# Patient Record
Sex: Female | Born: 1970 | Race: Black or African American | Hispanic: No | Marital: Single | Smoking: Never smoker
Health system: Southern US, Community
[De-identification: ages and names within clinical notes are randomized; demographics above are authoritative.]

## PROBLEM LIST (undated history)

## (undated) DIAGNOSIS — E119 Type 2 diabetes mellitus without complications: Secondary | ICD-10-CM

## (undated) DIAGNOSIS — K5900 Constipation, unspecified: Secondary | ICD-10-CM

## (undated) DIAGNOSIS — K3184 Gastroparesis: Secondary | ICD-10-CM

## (undated) DIAGNOSIS — Z992 Dependence on renal dialysis: Secondary | ICD-10-CM

## (undated) DIAGNOSIS — F32A Depression, unspecified: Secondary | ICD-10-CM

## (undated) DIAGNOSIS — E785 Hyperlipidemia, unspecified: Secondary | ICD-10-CM

## (undated) DIAGNOSIS — N186 End stage renal disease: Secondary | ICD-10-CM

## (undated) DIAGNOSIS — I10 Essential (primary) hypertension: Secondary | ICD-10-CM

## (undated) DIAGNOSIS — Z8639 Personal history of other endocrine, nutritional and metabolic disease: Secondary | ICD-10-CM

## (undated) DIAGNOSIS — R519 Headache, unspecified: Secondary | ICD-10-CM

## (undated) DIAGNOSIS — G629 Polyneuropathy, unspecified: Secondary | ICD-10-CM

## (undated) DIAGNOSIS — N189 Chronic kidney disease, unspecified: Secondary | ICD-10-CM

## (undated) HISTORY — DX: Chronic kidney disease, unspecified: N18.9

## (undated) HISTORY — DX: Essential (primary) hypertension: I10

## (undated) HISTORY — DX: Hyperlipidemia, unspecified: E78.5

## (undated) HISTORY — PX: BREAST REDUCTION SURGERY: SHX8

## (undated) HISTORY — PX: AV FISTULA PLACEMENT: SHX1204

## (undated) HISTORY — DX: Gastroparesis: K31.84

## (undated) HISTORY — PX: REDUCTION MAMMAPLASTY: SUR839

## (undated) HISTORY — DX: Type 2 diabetes mellitus without complications: E11.9

---

## 2019-04-26 DIAGNOSIS — Z87892 Personal history of anaphylaxis: Secondary | ICD-10-CM | POA: Insufficient documentation

## 2019-04-26 DIAGNOSIS — D689 Coagulation defect, unspecified: Secondary | ICD-10-CM | POA: Insufficient documentation

## 2019-04-26 DIAGNOSIS — N186 End stage renal disease: Secondary | ICD-10-CM | POA: Insufficient documentation

## 2019-04-26 DIAGNOSIS — Z23 Encounter for immunization: Secondary | ICD-10-CM | POA: Insufficient documentation

## 2019-04-26 DIAGNOSIS — N189 Chronic kidney disease, unspecified: Secondary | ICD-10-CM | POA: Insufficient documentation

## 2019-04-26 DIAGNOSIS — L299 Pruritus, unspecified: Secondary | ICD-10-CM | POA: Insufficient documentation

## 2019-04-26 DIAGNOSIS — N2581 Secondary hyperparathyroidism of renal origin: Secondary | ICD-10-CM | POA: Insufficient documentation

## 2019-04-26 DIAGNOSIS — D631 Anemia in chronic kidney disease: Secondary | ICD-10-CM | POA: Insufficient documentation

## 2019-06-24 ENCOUNTER — Telehealth (HOSPITAL_COMMUNITY): Payer: Self-pay

## 2019-06-24 NOTE — Telephone Encounter (Signed)

## 2019-06-25 ENCOUNTER — Other Ambulatory Visit: Payer: Self-pay | Admitting: *Deleted

## 2019-06-25 ENCOUNTER — Ambulatory Visit (INDEPENDENT_AMBULATORY_CARE_PROVIDER_SITE_OTHER): Payer: Medicare Other | Admitting: Vascular Surgery

## 2019-06-25 ENCOUNTER — Ambulatory Visit (HOSPITAL_COMMUNITY)
Admission: RE | Admit: 2019-06-25 | Discharge: 2019-06-25 | Disposition: A | Discharge status: Home / Self Care | Payer: Medicare Other | Source: Ambulatory Visit | Attending: Vascular Surgery | Admitting: Vascular Surgery

## 2019-06-25 ENCOUNTER — Other Ambulatory Visit: Payer: Self-pay

## 2019-06-25 ENCOUNTER — Encounter: Payer: Self-pay | Admitting: Vascular Surgery

## 2019-06-25 VITALS — BP 161/94 | HR 61 | Temp 97.3°F | Resp 20 | Ht 63.0 in | Wt 192.0 lb

## 2019-06-25 DIAGNOSIS — N186 End stage renal disease: Secondary | ICD-10-CM | POA: Diagnosis present

## 2019-06-25 NOTE — Progress Notes (Signed)
Patient ID: Kaitlyn Le, female   DOB: June 04, 1970, 49 y.o.   MRN: [id number]  Reason for Consult: New Patient (Initial Visit)   Referred by No ref. provider found  Subjective:     HPI:  Kaitlyn Le is a 49 y.o. female with history of end-stage renal disease on dialysis via right brachiocephalic AV fistula with history of multiple interventions most recently in May of last year.  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  She takes aspirin but no blood thinners.  She has never had left arm access.  Past Medical History:  Diagnosis Date  . Chronic kidney disease   . Gastroparesis   . Hyperlipidemia   . Hypertension    History reviewed. No pertinent family history. Past Surgical History:  Procedure Laterality Date  . AV FISTULA PLACEMENT    . BREAST REDUCTION SURGERY    . CESAREAN SECTION      Short Social History:  Social History   Tobacco Use  . Smoking status: Never Smoker  . Smokeless tobacco: Never Used  Substance Use Topics  . Alcohol use: Not Currently    Allergies  Allergen Reactions  . Penicillins Hives    Current Outpatient Medications  Medication Sig Dispense Refill  . amLODipine (NORVASC) 10 MG tablet Take 10 mg by mouth daily.    Marland Kitchen aspirin EC 81 MG tablet Take 81 mg by mouth daily.    . carvedilol (COREG) 12.5 MG tablet Take 12.5 mg by mouth 2 (two) times daily with a meal.    . DULoxetine (CYMBALTA) 60 MG capsule Take 60 mg by mouth daily.    . furosemide (LASIX) 40 MG tablet Take 40 mg by mouth daily.    Marland Kitchen losartan (COZAAR) 100 MG tablet Take 100 mg by mouth daily.    Marland Kitchen lovastatin (MEVACOR) 40 MG tablet Take 40 mg by mouth at bedtime.    . Multiple Vitamin (MULTIVITAMIN) capsule Take 1 capsule by mouth daily.    . sevelamer carbonate (RENVELA) 800 MG tablet Take 800 mg by mouth 3 (three) times daily with meals.    . Vitamin D, Ergocalciferol, (DRISDOL) 1.25 MG (50000 UNIT) CAPS capsule Take 50,000 Units by mouth every 7 (seven)  days.     No current facility-administered medications for this visit.    Review of Systems  Constitutional:  Constitutional negative. HENT: HENT negative.  Eyes: Eyes negative.  Respiratory: Respiratory negative.  Cardiovascular: Cardiovascular negative.  GI: Gastrointestinal negative.  Musculoskeletal: Musculoskeletal negative.       Right arm swelling Skin: Skin negative.  Neurological: Neurological negative. Hematologic: Hematologic/lymphatic negative.  Psychiatric: Psychiatric negative.        Objective:  Objective   Vitals:   06/25/19 0903  BP: (!) 161/94  Pulse: 61  Resp: 20  Temp: (!) 97.3 F (36.3 C)  SpO2: 100%    Physical Exam HENT:     Head: Normocephalic.  Eyes:     Pupils: Pupils are equal, round, and reactive to light.  Cardiovascular:     Rate and Rhythm: Normal rate and regular rhythm.  Pulmonary:     Effort: Pulmonary effort is normal.  Abdominal:     General: Abdomen is flat.     Palpations: Abdomen is soft.  Musculoskeletal:        General: Swelling present. Normal range of motion.     Cervical back: Normal range of motion and neck supple.  Skin:    General: Skin is warm and  dry.     Capillary Refill: Capillary refill takes less than 2 seconds.  Neurological:     General: No focal deficit present.     Mental Status: She is alert.  Psychiatric:        Mood and Affect: Mood normal.        Behavior: Behavior normal.        Thought Content: Thought content normal.        Judgment: Judgment normal.     Data: I have independently interpreted her dialysis duplex to have diameter as far as 1.42 cm in the distal upper arm.  Flow volume is 1070 mL/min.     Assessment/Plan:     49 year old female with end-stage renal disease currently with swelling in her right upper extremity on dialysis Mondays Wednesdays and Fridays.  Does not take blood thinners.  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  I discussed with her proceeding with right  arm and right groin access for possible treatment of her innominate vein occlusion.  I discussed with her the possibilities of damage to her lung or pericardium required extensive hospital stay as well as the risk of no intervention being performed with options being conversion to left arm access with ligation of right arm fistula in the future.  She does demonstrate good understanding I will get her scheduled for a nondialysis day in the very near future in our PV lab.     Waynetta Sandy MD [facility]

## 2019-06-29 ENCOUNTER — Other Ambulatory Visit (HOSPITAL_COMMUNITY)
Admission: RE | Admit: 2019-06-29 | Discharge: 2019-06-29 | Disposition: A | Discharge status: Home / Self Care | Payer: Medicare Other | Source: Ambulatory Visit | Attending: Vascular Surgery | Admitting: Vascular Surgery

## 2019-06-29 DIAGNOSIS — Z79899 Other long term (current) drug therapy: Secondary | ICD-10-CM | POA: Diagnosis not present

## 2019-06-29 DIAGNOSIS — Z7982 Long term (current) use of aspirin: Secondary | ICD-10-CM | POA: Diagnosis not present

## 2019-06-29 DIAGNOSIS — Z992 Dependence on renal dialysis: Secondary | ICD-10-CM | POA: Diagnosis not present

## 2019-06-29 DIAGNOSIS — Z20822 Contact with and (suspected) exposure to covid-19: Secondary | ICD-10-CM | POA: Diagnosis not present

## 2019-06-29 DIAGNOSIS — E785 Hyperlipidemia, unspecified: Secondary | ICD-10-CM | POA: Diagnosis not present

## 2019-06-29 DIAGNOSIS — K3184 Gastroparesis: Secondary | ICD-10-CM | POA: Diagnosis not present

## 2019-06-29 DIAGNOSIS — N186 End stage renal disease: Secondary | ICD-10-CM | POA: Diagnosis not present

## 2019-06-29 DIAGNOSIS — I120 Hypertensive chronic kidney disease with stage 5 chronic kidney disease or end stage renal disease: Secondary | ICD-10-CM | POA: Diagnosis present

## 2019-06-29 LAB — SARS CORONAVIRUS 2 (TAT 6-24 HRS): SARS Coronavirus 2: NEGATIVE

## 2019-07-01 ENCOUNTER — Ambulatory Visit (HOSPITAL_BASED_OUTPATIENT_CLINIC_OR_DEPARTMENT_OTHER): Payer: Medicare Other

## 2019-07-01 ENCOUNTER — Other Ambulatory Visit: Payer: Self-pay

## 2019-07-01 ENCOUNTER — Ambulatory Visit (HOSPITAL_COMMUNITY)
Admission: RE | Admit: 2019-07-01 | Discharge: 2019-07-01 | Disposition: A | Discharge status: Home / Self Care | Payer: Medicare Other | Attending: Vascular Surgery | Admitting: Vascular Surgery

## 2019-07-01 ENCOUNTER — Encounter (HOSPITAL_COMMUNITY)
Admission: RE | Disposition: A | Discharge status: Home / Self Care | Payer: Self-pay | Source: Home / Self Care | Attending: Vascular Surgery

## 2019-07-01 DIAGNOSIS — N186 End stage renal disease: Secondary | ICD-10-CM | POA: Insufficient documentation

## 2019-07-01 DIAGNOSIS — I120 Hypertensive chronic kidney disease with stage 5 chronic kidney disease or end stage renal disease: Secondary | ICD-10-CM | POA: Insufficient documentation

## 2019-07-01 DIAGNOSIS — Z01810 Encounter for preprocedural cardiovascular examination: Secondary | ICD-10-CM

## 2019-07-01 DIAGNOSIS — E785 Hyperlipidemia, unspecified: Secondary | ICD-10-CM | POA: Diagnosis not present

## 2019-07-01 DIAGNOSIS — Z20822 Contact with and (suspected) exposure to covid-19: Secondary | ICD-10-CM | POA: Diagnosis not present

## 2019-07-01 DIAGNOSIS — Z7982 Long term (current) use of aspirin: Secondary | ICD-10-CM | POA: Insufficient documentation

## 2019-07-01 DIAGNOSIS — Z79899 Other long term (current) drug therapy: Secondary | ICD-10-CM | POA: Insufficient documentation

## 2019-07-01 DIAGNOSIS — T82898A Other specified complication of vascular prosthetic devices, implants and grafts, initial encounter: Secondary | ICD-10-CM | POA: Diagnosis not present

## 2019-07-01 DIAGNOSIS — K3184 Gastroparesis: Secondary | ICD-10-CM | POA: Insufficient documentation

## 2019-07-01 DIAGNOSIS — Z992 Dependence on renal dialysis: Secondary | ICD-10-CM | POA: Insufficient documentation

## 2019-07-01 HISTORY — PX: A/V FISTULAGRAM: CATH118298

## 2019-07-01 HISTORY — PX: PERIPHERAL VASCULAR BALLOON ANGIOPLASTY: CATH118281

## 2019-07-01 LAB — POCT I-STAT, CHEM 8
BUN: 44 mg/dL — ABNORMAL HIGH (ref 6–20)
Calcium, Ion: 1.13 mmol/L — ABNORMAL LOW (ref 1.15–1.40)
Chloride: 95 mmol/L — ABNORMAL LOW (ref 98–111)
Creatinine, Ser: 5.9 mg/dL — ABNORMAL HIGH (ref 0.44–1.00)
Glucose, Bld: 156 mg/dL — ABNORMAL HIGH (ref 70–99)
HCT: 33 % — ABNORMAL LOW (ref 36.0–46.0)
Hemoglobin: 11.2 g/dL — ABNORMAL LOW (ref 12.0–15.0)
Potassium: 4.1 mmol/L (ref 3.5–5.1)
Sodium: 139 mmol/L (ref 135–145)
TCO2: 37 mmol/L — ABNORMAL HIGH (ref 22–32)

## 2019-07-01 SURGERY — A/V FISTULAGRAM
Anesthesia: LOCAL

## 2019-07-01 MED ORDER — ACETAMINOPHEN 325 MG PO TABS: 650.0000 mg | ORAL_TABLET | ORAL | Status: DC | PRN

## 2019-07-01 MED ORDER — HEPARIN (PORCINE) IN NACL 1000-0.9 UT/500ML-% IV SOLN
INTRAVENOUS | Status: AC
  Filled 2019-07-01: qty 500

## 2019-07-01 MED ORDER — SODIUM CHLORIDE 0.9 % IV SOLN: 250.0000 mL | INTRAVENOUS | Status: DC | PRN

## 2019-07-01 MED ORDER — MIDAZOLAM HCL 2 MG/2ML IJ SOLN
INTRAMUSCULAR | Status: DC | PRN
  Administered 2019-07-01 (×2): 1 mg via INTRAVENOUS

## 2019-07-01 MED ORDER — SODIUM CHLORIDE 0.9% FLUSH: 3.0000 mL | INTRAVENOUS | Status: DC | PRN

## 2019-07-01 MED ORDER — MIDAZOLAM HCL 2 MG/2ML IJ SOLN
INTRAMUSCULAR | Status: AC
  Filled 2019-07-01: qty 2

## 2019-07-01 MED ORDER — ONDANSETRON HCL 4 MG/2ML IJ SOLN: 4.0000 mg | Freq: Four times a day (QID) | INTRAMUSCULAR | Status: DC | PRN

## 2019-07-01 MED ORDER — HEPARIN (PORCINE) IN NACL 1000-0.9 UT/500ML-% IV SOLN: INTRAVENOUS | Status: DC | PRN

## 2019-07-01 MED ORDER — LIDOCAINE HCL (PF) 1 % IJ SOLN
INTRAMUSCULAR | Status: DC | PRN
  Administered 2019-07-01: 2 mL
  Administered 2019-07-01: 20 mL

## 2019-07-01 MED ORDER — SODIUM CHLORIDE 0.9% FLUSH: 3.0000 mL | Freq: Two times a day (BID) | INTRAVENOUS | Status: DC

## 2019-07-01 MED ORDER — LABETALOL HCL 5 MG/ML IV SOLN: 10.0000 mg | INTRAVENOUS | Status: DC | PRN

## 2019-07-01 MED ORDER — LIDOCAINE HCL (PF) 1 % IJ SOLN
INTRAMUSCULAR | Status: AC
  Filled 2019-07-01: qty 30

## 2019-07-01 MED ORDER — FENTANYL CITRATE (PF) 100 MCG/2ML IJ SOLN
INTRAMUSCULAR | Status: AC
  Filled 2019-07-01: qty 2

## 2019-07-01 MED ORDER — IODIXANOL 320 MG/ML IV SOLN
INTRAVENOUS | Status: DC | PRN
  Administered 2019-07-01: 85 mL via INTRA_ARTERIAL

## 2019-07-01 MED ORDER — HYDRALAZINE HCL 20 MG/ML IJ SOLN: 5.0000 mg | INTRAMUSCULAR | Status: DC | PRN

## 2019-07-01 MED ORDER — HEPARIN (PORCINE) IN NACL 1000-0.9 UT/500ML-% IV SOLN
INTRAVENOUS | Status: DC | PRN
  Administered 2019-07-01: 500 mL

## 2019-07-01 MED ORDER — FENTANYL CITRATE (PF) 100 MCG/2ML IJ SOLN
INTRAMUSCULAR | Status: DC | PRN
  Administered 2019-07-01 (×2): 50 ug via INTRAVENOUS

## 2019-07-01 SURGICAL SUPPLY — 26 items
BAG SNAP BAND KOVER 36X36 (MISCELLANEOUS) ×3 IMPLANT
CATH ANGIO 5F BER2 100CM (CATHETERS) ×1 IMPLANT
CATH ANGIO 5F BER2 65CM (CATHETERS) ×1 IMPLANT
CATH BEACON 5 .035 65 KMP TIP (CATHETERS) ×1 IMPLANT
CATH CXI 2.3F 90 ANG 2 (CATHETERS) ×1 IMPLANT
CATH LAUNCHER 6FR JR4 (CATHETERS) ×1 IMPLANT
CATH QUICKCROSS SUPP .035X90CM (MICROCATHETER) ×1 IMPLANT
COVER DOME SNAP 22 D (MISCELLANEOUS) ×3 IMPLANT
DEVICE TORQUE H2O (MISCELLANEOUS) ×1 IMPLANT
GLIDEWIRE ANGLED SS 035X260CM (WIRE) ×1 IMPLANT
GUIDE CATH VISTA JR4 6F (CATHETERS) ×1 IMPLANT
KIT ENCORE 26 ADVANTAGE (KITS) ×1 IMPLANT
KIT ESSENTIALS PG (KITS) ×1 IMPLANT
KIT MICROPUNCTURE NIT STIFF (SHEATH) ×2 IMPLANT
PROTECTION STATION PRESSURIZED (MISCELLANEOUS) ×3
SHEATH DESTINATION MP 5FR 45CM (SHEATH) ×1 IMPLANT
SHEATH PINNACLE 5F 10CM (SHEATH) ×1 IMPLANT
SHEATH PINNACLE MP 7F 45CM (SHEATH) ×1 IMPLANT
SHEATH PROBE COVER 6X72 (BAG) ×4 IMPLANT
STATION PROTECTION PRESSURIZED (MISCELLANEOUS) ×2 IMPLANT
STOPCOCK MORSE 400PSI 3WAY (MISCELLANEOUS) ×3 IMPLANT
TRAY PV CATH (CUSTOM PROCEDURE TRAY) ×3 IMPLANT
TUBING CIL FLEX 10 FLL-RA (TUBING) ×4 IMPLANT
WIRE ASAHI CONFIANZPRO12 300CM (WIRE) ×1 IMPLANT
WIRE BENTSON .035X145CM (WIRE) ×1 IMPLANT
WIRE G V18X300CM (WIRE) ×2 IMPLANT

## 2019-07-01 NOTE — Op Note (Signed)
    Patient name: Kaitlyn Le MRN: [id number] DOB: Jun 02, 1970 Sex: female  07/01/2019 Pre-operative Diagnosis: esrd, occluded right innominate vein Post-operative diagnosis:  Same Surgeon:  Eda Paschal. Donzetta Matters, MD Assistant: Annamarie Major, MD Procedure Performed: 1.  Ultrasound-guided cannulation right arm AV fistula 2.  Right upper extremity fistulogram 3.  Ultrasound-guided cannulation right common femoral vein 4.  Central venogram 5.  Moderate sedation with fentanyl and Versed for 77 minutes   Indications: 49 year old female with end-stage renal disease.  She has a right arm fistula that was created 2 years ago in [place].  She now has known occluded innominate vein is indicated for fistulogram with also groin access for possible crossing of the occlusion.  Findings: There is a 1 cm occlusion of the subclavian into the right innominate vein.  Right internal jugular vein appears to be occluded as well.  Despite many wires and catheters were unable to cross the occluded segment.    Ultimately patient will need left arm access with plus or minus ligation of her right arm fistula with tunnel catheter.  Alternatively she could continue to use her right arm access while letting her left arm mature.   Procedure:  The patient was identified in the holding area and taken to room 8.  The patient was then placed supine on the table and prepped and draped in the usual sterile fashion.  A time out was called.  Ultrasound was used to evaluate the right arm AV fistula.  This was cannulated micropuncture needle followed by wire and sheath.  A right upper extremity fistulogram was performed.  We then placed a 5 French sheath.  I attempted to use ber catheter and Glidewire to cross from above but could not.  Dr. Trula Slade scrubbed and given the difficulty in dual access in nature of this case.  We used a V 18 wire from above could not get across..  I then used ultrasound to evaluate the right common femoral vein.   This was cannulated with 18-gauge needle Bentson wire was placed followed by 5 French sheath.  Ultimately placed a long 7 French sheath into the right atrium.  I also used guide catheters JR4 100 and length.  We used a combination of a Glidewire, V 18 wire, compounds a wire both the front and back of all of these.  We also placed a long sheath in the right arm which was a 5 [place] sheath and used the same wires from above and below.  We performed central and peripheral imaging to evaluate in multiple angles but could not cross into adequate plane.  At 1 point we likely had a small perforation retrograde did not appear to be any active extravasation.  Patient did tolerate this procedure well.  We will proceed with left arm access in the near future and vein mapping was ordered today.   Contrast: 85 cc  Rayen Palen C. Donzetta Matters, MD [facility] [place] Office: [phone] Pager: [phone]

## 2019-07-01 NOTE — Progress Notes (Signed)
Upper extremity vein mapping has been completed.   Preliminary results in CV Proc.   Abram Sander 07/01/2019 2:18 PM

## 2019-07-01 NOTE — H&P (Signed)
   History and Physical Update  The patient was interviewed and re-examined.  The patient's previous History and Physical has been reviewed and is unchanged from recent office visit. Plan for fistulogram with right groin access today in pv lab.  Reiley Bertagnolli C. Donzetta Matters, MD [facility] [place] Office: [phone] Pager: [phone]   07/01/2019, 8:38 AM

## 2019-07-01 NOTE — Discharge Instructions (Signed)

## 2019-07-06 ENCOUNTER — Other Ambulatory Visit: Payer: Self-pay

## 2019-07-20 DIAGNOSIS — R0600 Dyspnea, unspecified: Secondary | ICD-10-CM | POA: Insufficient documentation

## 2019-07-20 DIAGNOSIS — Z888 Allergy status to other drugs, medicaments and biological substances status: Secondary | ICD-10-CM | POA: Insufficient documentation

## 2019-07-20 DIAGNOSIS — D509 Iron deficiency anemia, unspecified: Secondary | ICD-10-CM | POA: Insufficient documentation

## 2019-07-31 ENCOUNTER — Other Ambulatory Visit (HOSPITAL_COMMUNITY): Payer: Medicare Other

## 2019-08-02 ENCOUNTER — Other Ambulatory Visit: Payer: Self-pay

## 2019-08-02 ENCOUNTER — Other Ambulatory Visit (HOSPITAL_COMMUNITY)
Admission: RE | Admit: 2019-08-02 | Discharge: 2019-08-02 | Disposition: A | Discharge status: Home / Self Care | Payer: Medicare Other | Source: Ambulatory Visit | Attending: Vascular Surgery | Admitting: Vascular Surgery

## 2019-08-02 ENCOUNTER — Encounter (HOSPITAL_COMMUNITY): Payer: Self-pay | Admitting: Vascular Surgery

## 2019-08-02 DIAGNOSIS — Z01812 Encounter for preprocedural laboratory examination: Secondary | ICD-10-CM | POA: Insufficient documentation

## 2019-08-02 DIAGNOSIS — Z20822 Contact with and (suspected) exposure to covid-19: Secondary | ICD-10-CM | POA: Diagnosis not present

## 2019-08-02 LAB — SARS CORONAVIRUS 2 (TAT 6-24 HRS): SARS Coronavirus 2: NEGATIVE

## 2019-08-02 NOTE — Progress Notes (Signed)
Spoke with pt for pre-op call. Pt denies cardiac history, but is treated for HTN and diagnosed with Diabetes. States she is diet controlled. Pt states she does not check her blood sugar at home. Pt doesn't know when or what her last A1C was. States she has recently moved here and has not had it done anywhere. Pt gave me a name of a medical group in [place], but it's not in [place].  Pt is on dialysis on M/W/F  Covid test done today, pt states she is in quarantine and understands to stay in it until she comes to the hospital tomorrow.

## 2019-08-03 ENCOUNTER — Ambulatory Visit (HOSPITAL_COMMUNITY): Payer: Medicare Other | Admitting: Certified Registered Nurse Anesthetist

## 2019-08-03 ENCOUNTER — Ambulatory Visit (HOSPITAL_COMMUNITY): Payer: Medicare Other

## 2019-08-03 ENCOUNTER — Ambulatory Visit (HOSPITAL_COMMUNITY)
Admission: RE | Admit: 2019-08-03 | Discharge: 2019-08-03 | Disposition: A | Discharge status: Home / Self Care | Payer: Medicare Other | Attending: Vascular Surgery | Admitting: Vascular Surgery

## 2019-08-03 ENCOUNTER — Encounter (HOSPITAL_COMMUNITY): Payer: Self-pay | Admitting: Vascular Surgery

## 2019-08-03 ENCOUNTER — Other Ambulatory Visit: Payer: Self-pay

## 2019-08-03 ENCOUNTER — Encounter (HOSPITAL_COMMUNITY)
Admission: RE | Disposition: A | Discharge status: Home / Self Care | Payer: Self-pay | Source: Home / Self Care | Attending: Vascular Surgery

## 2019-08-03 DIAGNOSIS — Z880 Allergy status to penicillin: Secondary | ICD-10-CM | POA: Insufficient documentation

## 2019-08-03 DIAGNOSIS — N186 End stage renal disease: Secondary | ICD-10-CM | POA: Insufficient documentation

## 2019-08-03 DIAGNOSIS — Z79899 Other long term (current) drug therapy: Secondary | ICD-10-CM | POA: Insufficient documentation

## 2019-08-03 DIAGNOSIS — E1143 Type 2 diabetes mellitus with diabetic autonomic (poly)neuropathy: Secondary | ICD-10-CM | POA: Diagnosis not present

## 2019-08-03 DIAGNOSIS — Z992 Dependence on renal dialysis: Secondary | ICD-10-CM | POA: Diagnosis not present

## 2019-08-03 DIAGNOSIS — K3184 Gastroparesis: Secondary | ICD-10-CM | POA: Insufficient documentation

## 2019-08-03 DIAGNOSIS — E785 Hyperlipidemia, unspecified: Secondary | ICD-10-CM | POA: Insufficient documentation

## 2019-08-03 DIAGNOSIS — T82898A Other specified complication of vascular prosthetic devices, implants and grafts, initial encounter: Secondary | ICD-10-CM | POA: Diagnosis not present

## 2019-08-03 DIAGNOSIS — Z419 Encounter for procedure for purposes other than remedying health state, unspecified: Secondary | ICD-10-CM

## 2019-08-03 DIAGNOSIS — E1122 Type 2 diabetes mellitus with diabetic chronic kidney disease: Secondary | ICD-10-CM | POA: Diagnosis not present

## 2019-08-03 DIAGNOSIS — Z7982 Long term (current) use of aspirin: Secondary | ICD-10-CM | POA: Diagnosis not present

## 2019-08-03 DIAGNOSIS — Z9889 Other specified postprocedural states: Secondary | ICD-10-CM

## 2019-08-03 DIAGNOSIS — I120 Hypertensive chronic kidney disease with stage 5 chronic kidney disease or end stage renal disease: Secondary | ICD-10-CM | POA: Insufficient documentation

## 2019-08-03 DIAGNOSIS — Z791 Long term (current) use of non-steroidal anti-inflammatories (NSAID): Secondary | ICD-10-CM | POA: Diagnosis not present

## 2019-08-03 HISTORY — PX: AV FISTULA PLACEMENT: SHX1204

## 2019-08-03 HISTORY — PX: LIGATION OF ARTERIOVENOUS  FISTULA: SHX5948

## 2019-08-03 HISTORY — PX: INSERTION OF DIALYSIS CATHETER: SHX1324

## 2019-08-03 LAB — GLUCOSE, CAPILLARY
Glucose-Capillary: 148 mg/dL — ABNORMAL HIGH (ref 70–99)
Glucose-Capillary: 127 mg/dL — ABNORMAL HIGH (ref 70–99)
Glucose-Capillary: 143 mg/dL — ABNORMAL HIGH (ref 70–99)

## 2019-08-03 LAB — POCT I-STAT, CHEM 8
BUN: 43 mg/dL — ABNORMAL HIGH (ref 6–20)
Calcium, Ion: 0.96 mmol/L — ABNORMAL LOW (ref 1.15–1.40)
Chloride: 100 mmol/L (ref 98–111)
Creatinine, Ser: 5.7 mg/dL — ABNORMAL HIGH (ref 0.44–1.00)
Glucose, Bld: 136 mg/dL — ABNORMAL HIGH (ref 70–99)
HCT: 35 % — ABNORMAL LOW (ref 36.0–46.0)
Hemoglobin: 11.9 g/dL — ABNORMAL LOW (ref 12.0–15.0)
Potassium: 4 mmol/L (ref 3.5–5.1)
Sodium: 142 mmol/L (ref 135–145)
TCO2: 30 mmol/L (ref 22–32)

## 2019-08-03 SURGERY — ARTERIOVENOUS (AV) FISTULA CREATION
Anesthesia: General | Site: Neck | Laterality: Right

## 2019-08-03 MED ORDER — OXYCODONE HCL 5 MG PO TABS: 5.0000 mg | ORAL_TABLET | Freq: Once | ORAL | Status: DC | PRN

## 2019-08-03 MED ORDER — ACETAMINOPHEN 10 MG/ML IV SOLN: 1000.0000 mg | Freq: Once | INTRAVENOUS | Status: DC | PRN

## 2019-08-03 MED ORDER — SODIUM CHLORIDE 0.9 % IV SOLN
INTRAVENOUS | Status: DC | PRN
  Administered 2019-08-03: 500 mL

## 2019-08-03 MED ORDER — ROCURONIUM BROMIDE 50 MG/5ML IV SOSY
PREFILLED_SYRINGE | INTRAVENOUS | Status: DC | PRN
  Administered 2019-08-03: 40 mg via INTRAVENOUS

## 2019-08-03 MED ORDER — FENTANYL CITRATE (PF) 250 MCG/5ML IJ SOLN
INTRAMUSCULAR | Status: AC
  Filled 2019-08-03: qty 5

## 2019-08-03 MED ORDER — PROPOFOL 10 MG/ML IV BOLUS
INTRAVENOUS | Status: AC
  Filled 2019-08-03: qty 20

## 2019-08-03 MED ORDER — HEPARIN SOD (PORK) LOCK FLUSH 100 UNIT/ML IV SOLN
INTRAVENOUS | Status: AC
  Filled 2019-08-03: qty 5

## 2019-08-03 MED ORDER — 0.9 % SODIUM CHLORIDE (POUR BTL) OPTIME
TOPICAL | Status: DC | PRN
  Administered 2019-08-03: 11:00:00 1000 mL

## 2019-08-03 MED ORDER — LIDOCAINE 2% (20 MG/ML) 5 ML SYRINGE
INTRAMUSCULAR | Status: AC
  Filled 2019-08-03: qty 5

## 2019-08-03 MED ORDER — SODIUM CHLORIDE 0.9 % IV SOLN
INTRAVENOUS | Status: AC
  Filled 2019-08-03: qty 1.2

## 2019-08-03 MED ORDER — FENTANYL CITRATE (PF) 100 MCG/2ML IJ SOLN
25.0000 ug | INTRAMUSCULAR | Status: DC | PRN
  Administered 2019-08-03: 50 ug via INTRAVENOUS
  Administered 2019-08-03 (×2): 25 ug via INTRAVENOUS

## 2019-08-03 MED ORDER — MIDAZOLAM HCL 2 MG/2ML IJ SOLN
INTRAMUSCULAR | Status: AC
  Filled 2019-08-03: qty 2

## 2019-08-03 MED ORDER — ONDANSETRON HCL 4 MG/2ML IJ SOLN
INTRAMUSCULAR | Status: DC | PRN
  Administered 2019-08-03: 4 mg via INTRAVENOUS

## 2019-08-03 MED ORDER — OXYCODONE HCL 5 MG/5ML PO SOLN: 5.0000 mg | Freq: Once | ORAL | Status: DC | PRN

## 2019-08-03 MED ORDER — PAPAVERINE HCL 30 MG/ML IJ SOLN
INTRAMUSCULAR | Status: AC
  Filled 2019-08-03: qty 2

## 2019-08-03 MED ORDER — FENTANYL CITRATE (PF) 250 MCG/5ML IJ SOLN
INTRAMUSCULAR | Status: DC | PRN
  Administered 2019-08-03 (×2): 50 ug via INTRAVENOUS
  Administered 2019-08-03: 100 ug via INTRAVENOUS
  Administered 2019-08-03: 50 ug via INTRAVENOUS

## 2019-08-03 MED ORDER — PHENYLEPHRINE 40 MCG/ML (10ML) SYRINGE FOR IV PUSH (FOR BLOOD PRESSURE SUPPORT)
PREFILLED_SYRINGE | INTRAVENOUS | Status: AC
  Filled 2019-08-03: qty 10

## 2019-08-03 MED ORDER — PROPOFOL 10 MG/ML IV BOLUS
INTRAVENOUS | Status: DC | PRN
  Administered 2019-08-03: 90 mg via INTRAVENOUS

## 2019-08-03 MED ORDER — LIDOCAINE 2% (20 MG/ML) 5 ML SYRINGE
INTRAMUSCULAR | Status: DC | PRN
  Administered 2019-08-03: 60 mg via INTRAVENOUS

## 2019-08-03 MED ORDER — VANCOMYCIN HCL IN DEXTROSE 1-5 GM/200ML-% IV SOLN
INTRAVENOUS | Status: AC
  Filled 2019-08-03: qty 200

## 2019-08-03 MED ORDER — OXYCODONE HCL 5 MG PO TABS: 5.0000 mg | ORAL_TABLET | ORAL | 0 refills | Status: DC | PRN

## 2019-08-03 MED ORDER — LIDOCAINE-EPINEPHRINE 1 %-1:100000 IJ SOLN
INTRAMUSCULAR | Status: AC
  Filled 2019-08-03: qty 1

## 2019-08-03 MED ORDER — ROCURONIUM BROMIDE 10 MG/ML (PF) SYRINGE
PREFILLED_SYRINGE | INTRAVENOUS | Status: AC
  Filled 2019-08-03: qty 10

## 2019-08-03 MED ORDER — HEPARIN SODIUM (PORCINE) 1000 UNIT/ML IJ SOLN
INTRAMUSCULAR | Status: DC | PRN
  Administered 2019-08-03: 3000 [IU] via INTRAVENOUS

## 2019-08-03 MED ORDER — DEXAMETHASONE SODIUM PHOSPHATE 10 MG/ML IJ SOLN
INTRAMUSCULAR | Status: DC | PRN
  Administered 2019-08-03: 5 mg via INTRAVENOUS

## 2019-08-03 MED ORDER — FENTANYL CITRATE (PF) 100 MCG/2ML IJ SOLN
INTRAMUSCULAR | Status: AC
  Filled 2019-08-03: qty 2

## 2019-08-03 MED ORDER — SODIUM CHLORIDE 0.9 % IV SOLN: INTRAVENOUS | Status: DC

## 2019-08-03 MED ORDER — ONDANSETRON HCL 4 MG/2ML IJ SOLN
INTRAMUSCULAR | Status: AC
  Filled 2019-08-03: qty 2

## 2019-08-03 MED ORDER — ACETAMINOPHEN 160 MG/5ML PO SOLN: 1000.0000 mg | Freq: Once | ORAL | Status: DC | PRN

## 2019-08-03 MED ORDER — SUCCINYLCHOLINE CHLORIDE 200 MG/10ML IV SOSY
PREFILLED_SYRINGE | INTRAVENOUS | Status: AC
  Filled 2019-08-03: qty 10

## 2019-08-03 MED ORDER — MIDAZOLAM HCL 5 MG/5ML IJ SOLN
INTRAMUSCULAR | Status: DC | PRN
  Administered 2019-08-03: 2 mg via INTRAVENOUS

## 2019-08-03 MED ORDER — ACETAMINOPHEN 500 MG PO TABS: 1000.0000 mg | ORAL_TABLET | Freq: Once | ORAL | Status: DC | PRN

## 2019-08-03 MED ORDER — EPHEDRINE 5 MG/ML INJ
INTRAVENOUS | Status: AC
  Filled 2019-08-03: qty 10

## 2019-08-03 MED ORDER — DEXAMETHASONE SODIUM PHOSPHATE 10 MG/ML IJ SOLN
INTRAMUSCULAR | Status: AC
  Filled 2019-08-03: qty 2

## 2019-08-03 MED ORDER — SUGAMMADEX SODIUM 200 MG/2ML IV SOLN
INTRAVENOUS | Status: DC | PRN
  Administered 2019-08-03: 180 mg via INTRAVENOUS

## 2019-08-03 MED ORDER — LIDOCAINE-EPINEPHRINE 0.5 %-1:200000 IJ SOLN
INTRAMUSCULAR | Status: AC
  Filled 2019-08-03: qty 1

## 2019-08-03 SURGICAL SUPPLY — 58 items
ARMBAND PINK RESTRICT EXTREMIT (MISCELLANEOUS) ×4 IMPLANT
BAG DECANTER FOR FLEXI CONT (MISCELLANEOUS) ×4 IMPLANT
BIOPATCH RED 1 DISK 7.0 (GAUZE/BANDAGES/DRESSINGS) ×4 IMPLANT
CANISTER SUCT 3000ML PPV (MISCELLANEOUS) ×4 IMPLANT
CATH PALINDROME RT-P 15FX19CM (CATHETERS) ×4 IMPLANT
CATH PALINDROME RT-P 15FX23CM (CATHETERS) IMPLANT
CATH PALINDROME RT-P 15FX28CM (CATHETERS) IMPLANT
CATH PALINDROME RT-P 15FX55CM (CATHETERS) IMPLANT
CLIP VESOCCLUDE MED 6/CT (CLIP) ×4 IMPLANT
CLIP VESOCCLUDE SM WIDE 6/CT (CLIP) ×4 IMPLANT
COVER PROBE W GEL 5X96 (DRAPES) ×4 IMPLANT
COVER SURGICAL LIGHT HANDLE (MISCELLANEOUS) ×4 IMPLANT
COVER WAND RF STERILE (DRAPES) ×4 IMPLANT
DERMABOND ADVANCED (GAUZE/BANDAGES/DRESSINGS) ×2
DERMABOND ADVANCED .7 DNX12 (GAUZE/BANDAGES/DRESSINGS) ×6 IMPLANT
DRAPE C-ARM 42X72 X-RAY (DRAPES) ×4 IMPLANT
DRAPE CHEST BREAST 15X10 FENES (DRAPES) ×4 IMPLANT
DRAPE ORTHO SPLIT 77X108 STRL (DRAPES) ×1
DRAPE SURG ORHT 6 SPLT 77X108 (DRAPES) ×3 IMPLANT
ELECT REM PT RETURN 9FT ADLT (ELECTROSURGICAL) ×4
ELECTRODE REM PT RTRN 9FT ADLT (ELECTROSURGICAL) ×3 IMPLANT
GAUZE 4X4 16PLY RFD (DISPOSABLE) ×4 IMPLANT
GAUZE SPONGE 4X4 12PLY STRL (GAUZE/BANDAGES/DRESSINGS) ×4 IMPLANT
GLOVE BIO SURGEON STRL SZ7.5 (GLOVE) ×8 IMPLANT
GLOVE BIOGEL PI IND STRL 6.5 (GLOVE) ×6 IMPLANT
GLOVE BIOGEL PI IND STRL 7.0 (GLOVE) ×3 IMPLANT
GLOVE BIOGEL PI INDICATOR 6.5 (GLOVE) ×2
GLOVE BIOGEL PI INDICATOR 7.0 (GLOVE) ×1
GLOVE SURG SS PI 7.5 STRL IVOR (GLOVE) ×4 IMPLANT
GOWN STRL REUS W/ TWL LRG LVL3 (GOWN DISPOSABLE) ×6 IMPLANT
GOWN STRL REUS W/ TWL XL LVL3 (GOWN DISPOSABLE) ×6 IMPLANT
GOWN STRL REUS W/TWL LRG LVL3 (GOWN DISPOSABLE) ×2
GOWN STRL REUS W/TWL XL LVL3 (GOWN DISPOSABLE) ×2
INSERT FOGARTY SM (MISCELLANEOUS) IMPLANT
KIT BASIN OR (CUSTOM PROCEDURE TRAY) ×4 IMPLANT
KIT TURNOVER KIT B (KITS) ×4 IMPLANT
NEEDLE 18GX1X1/2 (RX/OR ONLY) (NEEDLE) ×4 IMPLANT
NEEDLE HYPO 25GX1X1/2 BEV (NEEDLE) ×4 IMPLANT
NS IRRIG 1000ML POUR BTL (IV SOLUTION) ×4 IMPLANT
PACK CV ACCESS (CUSTOM PROCEDURE TRAY) ×4 IMPLANT
PACK SURGICAL SETUP 50X90 (CUSTOM PROCEDURE TRAY) ×4 IMPLANT
PAD ARMBOARD 7.5X6 YLW CONV (MISCELLANEOUS) ×8 IMPLANT
SOAP 2 % CHG 4 OZ (WOUND CARE) ×4 IMPLANT
SPONGE LAP 18X18 RF (DISPOSABLE) ×4 IMPLANT
SUT ETHILON 3 0 PS 1 (SUTURE) ×4 IMPLANT
SUT MNCRL AB 4-0 PS2 18 (SUTURE) ×8 IMPLANT
SUT PROLENE 6 0 BV (SUTURE) ×4 IMPLANT
SUT SILK 0 TIES 10X30 (SUTURE) ×4 IMPLANT
SUT VIC AB 3-0 SH 27 (SUTURE) ×2
SUT VIC AB 3-0 SH 27X BRD (SUTURE) ×6 IMPLANT
SYR 10ML LL (SYRINGE) ×8 IMPLANT
SYR 20ML LL LF (SYRINGE) ×8 IMPLANT
SYR 5ML LL (SYRINGE) ×4 IMPLANT
SYR CONTROL 10ML LL (SYRINGE) ×4 IMPLANT
TOWEL GREEN STERILE (TOWEL DISPOSABLE) ×4 IMPLANT
TOWEL GREEN STERILE FF (TOWEL DISPOSABLE) ×8 IMPLANT
UNDERPAD 30X30 (UNDERPADS AND DIAPERS) ×4 IMPLANT
WATER STERILE IRR 1000ML POUR (IV SOLUTION) ×4 IMPLANT

## 2019-08-03 NOTE — H&P (Signed)
H+P   History of Present Illness Kaitlyn Le is a 49 y.o. female with history of end-stage renal disease on dialysis via right brachiocephalic AV fistula with history of multiple interventions most recently in May of last year.  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  She takes aspirin but no blood thinners.  She has never had left arm access.  Past Medical History:  Diagnosis Date  . Chronic kidney disease    M/W/F - dialysis  . Diabetes mellitus without complication ([place])   . Gastroparesis   . Hyperlipidemia   . Hypertension     Past Surgical History:  Procedure Laterality Date  . A/V FISTULAGRAM N/A 07/01/2019   Procedure: A/V FISTULAGRAM;  Surgeon: Waynetta Sandy, MD;  Location: [facility];  Service: Cardiovascular;  Laterality: N/A;  . AV FISTULA PLACEMENT    . BREAST REDUCTION SURGERY    . CESAREAN SECTION    . PERIPHERAL VASCULAR BALLOON ANGIOPLASTY  07/01/2019   Procedure: PERIPHERAL VASCULAR BALLOON ANGIOPLASTY;  Surgeon: Waynetta Sandy, MD;  Location: [facility];  Service: Cardiovascular;;  right subclavian vein stenosed unable to cross lesion    Allergies  Allergen Reactions  . Penicillins Hives    Did it involve swelling of the face/tongue/throat, SOB, or low BP? Unknown Did it involve sudden or severe rash/hives, skin peeling, or any reaction on the inside of your mouth or nose? Unknown Did you need to seek medical attention at a hospital or doctor's office? Unknown When did it last happen?childhood reaction. If all above answers are "NO", may proceed with cephalosporin use.     Prior to Admission medications   Medication Sig Start Date End Date Taking? Authorizing Provider  amLODipine (NORVASC) 10 MG tablet Take 10 mg by mouth daily.   Yes [provider]  aspirin EC 81 MG tablet Take 81 mg by mouth daily.   Yes [provider]  B Complex-C-Zn-Folic Acid (DIALYVITE 867-YPPJ 15)  0.8 MG TABS Take 1 tablet by mouth daily. 05/05/19  Yes [provider]  carvedilol (COREG) 12.5 MG tablet Take 12.5 mg by mouth 2 (two) times daily with a meal.   Yes [provider]  diphenhydrAMINE (BENADRYL) 25 MG tablet Take 25 mg by mouth every 6 (six) hours as needed for allergies.   Yes [provider]  DULoxetine (CYMBALTA) 60 MG capsule Take 60 mg by mouth daily.   Yes [provider]  furosemide (LASIX) 40 MG tablet Take 40 mg by mouth daily.   Yes [provider]  Lactulose 20 GM/30ML SOLN Take 15 mLs by mouth daily as needed (constipation.).    Yes [provider]  losartan (COZAAR) 100 MG tablet Take 100 mg by mouth daily.   Yes [provider]  lovastatin (MEVACOR) 40 MG tablet Take 40 mg by mouth at bedtime.   Yes [provider]  sevelamer carbonate (RENVELA) 800 MG tablet Take 1,600-2,400 mg by mouth See admin instructions. Take 3 tablets (2400 mg) by mouth with each meal & take 2 tablets (1600 mg) by mouth with each snack   Yes [provider]  Vitamin D, Ergocalciferol, (DRISDOL) 1.25 MG (50000 UNIT) CAPS capsule Take 50,000 Units by mouth every Thursday.    Yes [provider]  ibuprofen (ADVIL) 200 MG tablet Take 400 mg by mouth every 6 (six) hours as needed for moderate pain.    [provider]    Social History  Socioeconomic History  . Marital status: Single    Spouse name: Not on file  . Number of children: Not on file  . Years of education: Not on file  . Highest education level: Not on file  Occupational History  . Not on file  Tobacco Use  . Smoking status: Never Smoker  . Smokeless tobacco: Never Used  Substance and Sexual Activity  . Alcohol use: Not Currently  . Drug use: Not Currently  . Sexual activity: Not on file  Other Topics Concern  . Not on file  Social History Narrative  . Not on file   Social Determinants of Health   Financial Resource  Strain:   . Difficulty of Paying Living Expenses:   Food Insecurity:   . Worried About Charity fundraiser in the Last Year:   . Arboriculturist in the Last Year:   Transportation Needs:   . Film/video editor (Medical):   Marland Kitchen Lack of Transportation (Non-Medical):   Physical Activity:   . Days of Exercise per Week:   . Minutes of Exercise per Session:   Stress:   . Feeling of Stress :   Social Connections:   . Frequency of Communication with Friends and Family:   . Frequency of Social Gatherings with Friends and Family:   . Attends Religious Services:   . Active Member of Clubs or Organizations:   . Attends Archivist Meetings:   Marland Kitchen Marital Status:   Intimate Partner Violence:   . Fear of Current or Ex-Partner:   . Emotionally Abused:   Marland Kitchen Physically Abused:   . Sexually Abused:     History reviewed. No pertinent family history.  ROS:  Constitutional:  Constitutional negative. HENT: HENT negative.  Eyes: Eyes negative.  Respiratory: Respiratory negative.  Cardiovascular: Cardiovascular negative.  GI: Gastrointestinal negative.  Musculoskeletal: Musculoskeletal negative.       Right arm swelling Skin: Skin negative.  Neurological: Neurological negative. Hematologic: Hematologic/lymphatic negative.  Psychiatric: Psychiatric negative.     Physical Examination  Vitals:   08/03/19 0800  BP: 131/67  Pulse: 74  Resp: 17  Temp: 98.2 F (36.8 C)  SpO2: 100%   Body mass index is 34.01 kg/m. HENT:     Head: Normocephalic.  Eyes:     Pupils: Pupils are equal, round, and reactive to light.  Cardiovascular:     Rate and Rhythm: Normal rate and regular rhythm.  Pulmonary:     Effort: Pulmonary effort is normal.  Abdominal:     General: Abdomen is flat.     Palpations: Abdomen is soft.  Musculoskeletal:        General: Swelling present. Normal range of motion.     Cervical back: Normal range of motion and neck supple.  Skin:    General: Skin is warm  and dry.     Capillary Refill: Capillary refill takes less than 2 seconds.  Neurological:     General: No focal deficit present.     Mental Status: She is alert.  Psychiatric:        Mood and Affect: Mood normal.        Behavior: Behavior normal.        Thought Content: Thought content normal.        Judgment: Judgment normal.    CBC    Component Value Date/Time   HGB 11.2 (L) 07/01/2019 0856   HCT 33.0 (L) 07/01/2019 0856    BMET    Component Value  Date/Time   NA 139 07/01/2019 0856   K 4.1 07/01/2019 0856   CL 95 (L) 07/01/2019 0856   GLUCOSE 156 (H) 07/01/2019 0856   BUN 44 (H) 07/01/2019 0856   CREATININE 5.90 (H) 07/01/2019 0856    COAGS: No results found for: INR, PROTIME    ASSESSMENT/PLAN: This is a 50 y.o. female with end-stage renal disease currently with swelling in her right upper extremity on dialysis Mondays Wednesdays and Fridays.  Does not take blood thinners.  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  We will now plan for left arm access with left IJ tunneled dialysis catheter and right arm fistula ligation.  Anastacia Reinecke C. Donzetta Matters, MD [facility] [place] Office: [phone] Pager: [phone]

## 2019-08-03 NOTE — Anesthesia Preprocedure Evaluation (Signed)
Anesthesia Evaluation  Patient identified by MRN, date of birth, ID band Patient awake    Reviewed: Allergy & Precautions, NPO status , Patient's Chart, lab work & pertinent test results  History of Anesthesia Complications Negative for: history of anesthetic complications  Airway Mallampati: II  TM Distance: >3 FB Neck ROM: Full    Dental  (+) Dental Advisory Given, Edentulous Upper, Edentulous Lower   Pulmonary neg shortness of breath, neg COPD, neg recent URI,    breath sounds clear to auscultation       Cardiovascular hypertension, Pt. on medications (-) angina(-) Past MI and (-) CHF (-) dysrhythmias  Rhythm:Regular     Neuro/Psych negative neurological ROS  negative psych ROS   GI/Hepatic negative GI ROS, Neg liver ROS,   Endo/Other  diabetesMorbid obesity  Renal/GU ESRF and DialysisRenal disease     Musculoskeletal   Abdominal   Peds  Hematology  (+) Blood dyscrasia, anemia ,   Anesthesia Other Findings   Reproductive/Obstetrics                             Anesthesia Physical Anesthesia Plan  ASA: III  Anesthesia Plan: General   Post-op Pain Management:    Induction: Intravenous  PONV Risk Score and Plan: 3 and Ondansetron and Dexamethasone  Airway Management Planned: Oral ETT  Additional Equipment: None  Intra-op Plan:   Post-operative Plan: Extubation in OR  Informed Consent: I have reviewed the patients History and Physical, chart, labs and discussed the procedure including the risks, benefits and alternatives for the proposed anesthesia with the patient or authorized representative who has indicated his/her understanding and acceptance.     Dental advisory given  Plan Discussed with: CRNA and Surgeon  Anesthesia Plan Comments:         Anesthesia Quick Evaluation

## 2019-08-03 NOTE — Anesthesia Procedure Notes (Signed)
Procedure Name: Intubation Date/Time: 08/03/2019 10:23 AM Performed by: Shirlyn Goltz, CRNA Pre-anesthesia Checklist: Emergency Drugs available, Patient identified, Suction available and Patient being monitored Patient Re-evaluated:Patient Re-evaluated prior to induction Oxygen Delivery Method: Circle system utilized Preoxygenation: Pre-oxygenation with 100% oxygen Induction Type: IV induction Ventilation: Mask ventilation without difficulty Laryngoscope Size: Mac and 3 Grade View: Grade I Tube type: Oral Tube size: 7.0 mm Number of attempts: 1 Airway Equipment and Method: Stylet Placement Confirmation: ETT inserted through vocal cords under direct vision,  positive ETCO2 and breath sounds checked- equal and bilateral Secured at: 22 cm Tube secured with: Tape Dental Injury: Teeth and Oropharynx as per pre-operative assessment

## 2019-08-03 NOTE — Discharge Instructions (Signed)
° °  [facility] [place] ° °Discharge Instructions ° °AV Fistula or Graft Surgery for Dialysis Access ° °Please refer to the following instructions for your post-procedure care. Your surgeon or physician assistant will discuss any changes with you. ° °Activity ° °You may drive the day following your surgery, if you are comfortable and no longer taking prescription pain medication. Resume full activity as the soreness in your incision resolves. ° °Bathing/Showering ° °You may shower after you go home. Keep your incision dry for 48 hours. Do not soak in a bathtub, hot tub, or swim until the incision heals completely. You may not shower if you have a hemodialysis catheter. ° °Incision Care ° °Clean your incision with mild soap and water after 48 hours. Pat the area dry with a clean towel. You do not need a bandage unless otherwise instructed. Do not apply any ointments or creams to your incision. You may have skin glue on your incision. Do not peel it off. It will come off on its own in about one week. Your arm may swell a bit after surgery. To reduce swelling use pillows to elevate your arm so it is above your heart. Your doctor will tell you if you need to lightly wrap your arm with an ACE bandage. ° °Diet ° °Resume your normal diet. There are not special food restrictions following this procedure. In order to heal from your surgery, it is CRITICAL to get adequate nutrition. Your body requires vitamins, minerals, and protein. Vegetables are the best source of vitamins and minerals. Vegetables also provide the perfect balance of protein. Processed food has little nutritional value, so try to avoid this. ° °Medications ° °Resume taking all of your medications. If your incision is causing pain, you may take over-the counter pain relievers such as acetaminophen (Tylenol). If you were prescribed a stronger pain medication, please be aware these medications can cause nausea and constipation. Prevent  nausea by taking the medication with a snack or meal. Avoid constipation by drinking plenty of fluids and eating foods with high amount of fiber, such as fruits, vegetables, and grains. Do not take Tylenol if you are taking prescription pain medications. ° ° ° ° °Follow up °Your surgeon may want to see you in the office following your access surgery. If so, this will be arranged at the time of your surgery. ° °Please call us immediately for any of the following conditions: ° °Increased pain, redness, drainage (pus) from your incision site °Fever of 101 degrees or higher °Severe or worsening pain at your incision site °Hand pain or numbness. ° °Reduce your risk of vascular disease: ° °Stop smoking. If you would like help, call [facility] at 1-800-QUIT-NOW (1-800-784-8669) or [facility] at 336-586-4000 ° °Manage your cholesterol °Maintain a desired weight °Control your diabetes °Keep your blood pressure down ° °Dialysis ° °It will take several weeks to several months for your new dialysis access to be ready for use. Your surgeon will determine when it is OK to use it. Your nephrologist will continue to direct your dialysis. You can continue to use your Permcath until your new access is ready for use. ° °If you have any questions, please call the office at [phone]. ° °

## 2019-08-03 NOTE — Op Note (Signed)
Patient name: Kaitlyn Le MRN: [id number] DOB: [birth year] Sex: female  08/03/2019 Pre-operative Diagnosis: esrd Post-operative diagnosis:  Same Surgeon:  Erlene Quan C. Donzetta Matters, MD Assistant: Laurence Slate, PA Procedure Performed: 1.  Left IJ 19 cm tunnel dialysis catheter placement with ultrasound and fluoroscopic guidance 2.  Creation of left first stage basilic vein fistula 3.  Ligation of right arm AV fistula  Indications: 49 year old female with history of end-stage renal disease on dialysis via right arm fistula.  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  She is now indicated for the above-noted procedures.  Findings: Left IJ was large and patent and compressible.  The wire passed centrally without any difficulty suggesting left-sided innominate vein is patent.  19 cm catheter was placed to the SVC atrial junction.  On the left arm the cephalic vein was actually approximately 4 mm at the antecubital but very diminutive in the upper arm.  Basilic vein was traced with ultrasound throughout the upper arm measured greater than 3 mm throughout most of its course.  At completion of fistula creation there was a strong thrill confirmed with Doppler and a palpable radial pulse at the wrist also confirmed with Doppler.  The right arm AV fistula was ligated.   Procedure:  The patient was identified in the holding area and taken to the operating room where general anesthesia was induced.  She was sterilely prepped and draped in the left neck and upper extremities was the right upper extremity at the site of the previous fistula.  Antibiotics were minister timeout was called.  We began with ultrasound-guided evaluation of the left internal jugular vein.  This was patent and compressible.  This was cannulated with 18-gauge needle and a wire was passed centrally under fluoroscopic guidance.  We tunneled a 19 cm catheter through the counterincision.  The wire  tract was serially dilated introducer sheath placed with fluoroscopic guidance.  We then placed the catheter to the SVC atrial junction.  We flushed with heparinized saline locked with 1.6 cc of concentrated heparin in both ports.  We affixed to the skin with 3-0 nylon suture and Dermabond is placed there and we also closed the neck incision with 4 Monocryl and Dermabond was placed.  Sterile dressing was placed and the caps were placed on the catheter.    We then turned our attention to the left upper extremity.  Using ultrasound identified what appeared to be a large basilic vein throughout the upper extremity.  The brachial artery did appear quite diminutive by duplex.  Transverse incision was made.  Dissected out the vein marked of orientation.  Branches were divided between ties.  I dissected through the deep fascia to the level of the artery and placed a vessel loop around this.  The artery was approximately 2-1/2 mm external diameter but was free of disease.  The vein was transected distally and tied off.  Flushed with heparinized saline and spatulated and clamped.  I then clamped the artery distally and proximally opened longitudinally.  I only flush the artery distally given its diminutive size.  I then sewed the vein inside with 6-0 Prolene suture.  Prior to completion allowed flushing all direction.  Upon completion there was a good thrill in the vein this was confirmed with Doppler.  Also had very good signal at the radial artery that ultimately was palpable at the wrist.  The wound was irrigated and hemostasis obtained and closed in layers of Vicryl  Monocryl and Dermabond placed to the level of the skin.  Attention was then turned to the right upper extremity.  Over the palpable thrill above the antecubitum I made a transverse incision.  I dissected out the fistula and placed a vessel loop around this.  I then clamped proximally and distally and transected it.  I tied it off more cephalad with 2-0  silk suture.  Towards the antecubitum I oversewed it with running 5-0 Prolene suture in a mattress fashion.  I then obtained hemostasis and the wound and irrigated and closed it with 4 Monocryl.  Dermabond is placed at this site.  She was then awakened anesthesia having tolerated procedure well or may complication but all counts were correct at completion.   EBL: 25cc  Basma Buchner C. Donzetta Matters, MD [facility] Office: [phone] Pager: [phone]

## 2019-08-03 NOTE — Transfer of Care (Signed)
Immediate Anesthesia Transfer of Care Note  Patient: Doriana Mazurkiewicz  Procedure(s) Performed: LEFT BASILIC ARTERIOVENOUS FISTULA CREATION (Left ) LIGATION OF ARTERIOVENOUS  FISTULA (Right ) INSERTION OF PALINDROME DIALYSIS CATHETER 19CM (Left Neck)  Patient Location: PACU  Anesthesia Type:General  Level of Consciousness: awake, alert , oriented and patient cooperative  Airway & Oxygen Therapy: Patient Spontanous Breathing and Patient connected to nasal cannula oxygen  Post-op Assessment: Report given to RN and Post -op Vital signs reviewed and stable  Post vital signs: Reviewed and stable  Last Vitals:  Vitals Value Taken Time  BP 165/96 08/03/19 1144  Temp    Pulse 73 08/03/19 1147  Resp 6 08/03/19 1147  SpO2 100 % 08/03/19 1147  Vitals shown include unvalidated device data.  Last Pain:  Vitals:   08/03/19 0910  TempSrc:   PainSc: 0-No pain         Complications: No apparent anesthesia complications

## 2019-08-04 ENCOUNTER — Encounter: Payer: Self-pay | Admitting: *Deleted

## 2019-08-07 NOTE — Anesthesia Postprocedure Evaluation (Signed)
Anesthesia Post Note  Patient: Treena Cosman  Procedure(s) Performed: LEFT BASILIC ARTERIOVENOUS FISTULA CREATION (Left ) LIGATION OF ARTERIOVENOUS  FISTULA (Right ) INSERTION OF PALINDROME DIALYSIS CATHETER 19CM (Left Neck)     Patient location during evaluation: PACU Anesthesia Type: General Level of consciousness: patient cooperative and awake Pain management: pain level controlled Vital Signs Assessment: post-procedure vital signs reviewed and stable Respiratory status: spontaneous breathing, nonlabored ventilation, respiratory function stable and patient connected to nasal cannula oxygen Cardiovascular status: blood pressure returned to baseline and stable Postop Assessment: no apparent nausea or vomiting Anesthetic complications: no    Last Vitals:  Vitals:   08/03/19 1315 08/03/19 1325  BP:  (!) 155/79  Pulse: 67 70  Resp: 10 (!) 8  Temp:  36.5 C  SpO2: 100% 100%    Last Pain:  Vitals:   08/03/19 1245  TempSrc:   PainSc: Asleep                 Onia Shiflett

## 2019-09-15 ENCOUNTER — Other Ambulatory Visit: Payer: Self-pay | Admitting: *Deleted

## 2019-09-15 DIAGNOSIS — N2889 Other specified disorders of kidney and ureter: Secondary | ICD-10-CM | POA: Insufficient documentation

## 2019-09-15 DIAGNOSIS — I151 Hypertension secondary to other renal disorders: Secondary | ICD-10-CM | POA: Insufficient documentation

## 2019-09-15 DIAGNOSIS — N186 End stage renal disease: Secondary | ICD-10-CM

## 2019-09-17 ENCOUNTER — Other Ambulatory Visit: Payer: Self-pay

## 2019-09-17 ENCOUNTER — Ambulatory Visit (INDEPENDENT_AMBULATORY_CARE_PROVIDER_SITE_OTHER): Payer: Self-pay | Admitting: Physician Assistant

## 2019-09-17 ENCOUNTER — Ambulatory Visit (HOSPITAL_COMMUNITY)
Admission: RE | Admit: 2019-09-17 | Discharge: 2019-09-17 | Disposition: A | Discharge status: Home / Self Care | Payer: Medicare Other | Source: Ambulatory Visit | Attending: Vascular Surgery | Admitting: Vascular Surgery

## 2019-09-17 VITALS — BP 175/95 | HR 78 | Temp 97.9°F | Resp 14 | Ht 63.0 in | Wt 183.0 lb

## 2019-09-17 DIAGNOSIS — N186 End stage renal disease: Secondary | ICD-10-CM

## 2019-09-17 NOTE — Progress Notes (Signed)
    Postoperative Access Visit   History of Present Illness   Kaitlyn Le is a 49 y.o. year old female who presents for postoperative follow-up for left IJ TDC, ligation of right arm AV fistula and creation of left first stage basilic vein fistula on 10/04/34 by Dr. Donzetta Matters. 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 Intervention was attempted on 07/01/19 to reopen the innominate vein during fistulogram however was unsuccessful despite multiple attempts. The patient's wounds are well healed.  The patient notes no steal symptoms. She says after her right AV fistula was ligated she has had significant improvement in her right upper extremity swelling. Otherwise her right arm is without any steal symptoms as well. She is having some tenderness in her left IJ TDC site but it is otherwise functioning well. She currently dialyzes MWF  Physical Examination   Vitals:   09/17/19 1044  BP: (!) 175/95  Pulse: 78  Resp: 14  Temp: 97.9 F (36.6 C)  TempSrc: Temporal  SpO2: 100%  Weight: 183 lb (83 kg)  Height: 5\' 3"  (1.6 m)   Body mass index is 32.42 kg/m.  left and right arms Incisions in bilateral Antecubitals are well healed, 2+ radial pulses, hand grip is 5/5, sensation in digits is intact. palpable thrill, bruit can be auscultated in left AV fistula. The fistula is not palpable in the left upper arm    +------------+----------+-------------+----------+----------------+  OUTFLOW VEINPSV (cm/s)Diameter (cm)Depth (cm)  Describe    +------------+----------+-------------+----------+----------------+  Prox UA     110    0.76     3.36            +------------+----------+-------------+----------+----------------+  Mid UA     86    0.82     2.55            +------------+----------+-------------+----------+----------------+  Dist UA     175     0.64     1.66  competing branch  +------------+----------+-------------+----------+----------------+  AC Fossa    205    0.62     0.33            +------------+----------+-------------+----------+----------------+  Left AV fistula with good diameter but deep in the left upper arm. Decreased velocity noted in mid upper arm likely secondary to competing branch  Medical Decision Making   Kaitlyn Le is a 49 y.o. year old female who presents s/p left IJ TDC, ligation of right arm AV fistula and creation of left first stage basilic vein fistula on 10/04/45 by Dr. Donzetta Matters. Her left BV fistula is patent and functioning well. The vein is maturing very well with diameter > 0.6.  The vein is deeper than 0.6 and will need to be transposed for easier access.  She is currently on HD MWF. Due to family plans she requests that her transposition be scheduled after July 10th,2021  Patent is without signs or symptoms of steal syndrome  She does not take any blood thinners  She will be scheduled on a non dialysis day  I have scheduled her for a second stage BV fistula transposition with Dr. Jeri Lager, PA-C [facility] Office: [phone]  Clinic MD: Dr. Donzetta Matters

## 2019-10-19 ENCOUNTER — Other Ambulatory Visit (HOSPITAL_COMMUNITY)
Admission: RE | Admit: 2019-10-19 | Discharge: 2019-10-19 | Disposition: A | Discharge status: Home / Self Care | Payer: Medicare Other | Source: Ambulatory Visit | Attending: Vascular Surgery | Admitting: Vascular Surgery

## 2019-10-19 ENCOUNTER — Encounter (HOSPITAL_COMMUNITY): Payer: Self-pay | Admitting: Vascular Surgery

## 2019-10-19 DIAGNOSIS — Z20822 Contact with and (suspected) exposure to covid-19: Secondary | ICD-10-CM | POA: Diagnosis present

## 2019-10-19 LAB — SARS CORONAVIRUS 2 (TAT 6-24 HRS): SARS Coronavirus 2: NEGATIVE

## 2019-10-19 NOTE — Progress Notes (Signed)
Ms Funnell denies chest pain or shortness of breath.  Patient denies s/s of Covid, she tested negative today and has been at home in quarantine with her daughter. Ms.Tackett has type II diabetes, diet controlled.. Patient reports that Hgb A1C was 5.2 at dialysis- in the past month. Patient does not check CBG, because A1C is normal, I asked her to check it in am to make sure it does not go to low. I instructed patient to check CBG after awaking and every 2 hours until arrival  to the hospital.  I Instructed patient if CBG is less than 70 to drink 1/2 cup of a clear juice. Recheck CBG in 15 minutes then call pre- op desk at [phone] for further instructions.

## 2019-10-21 ENCOUNTER — Encounter (HOSPITAL_COMMUNITY): Payer: Self-pay | Admitting: Vascular Surgery

## 2019-10-21 ENCOUNTER — Ambulatory Visit (HOSPITAL_COMMUNITY): Payer: Medicare Other | Admitting: Anesthesiology

## 2019-10-21 ENCOUNTER — Other Ambulatory Visit: Payer: Self-pay

## 2019-10-21 ENCOUNTER — Ambulatory Visit (HOSPITAL_COMMUNITY)
Admission: RE | Admit: 2019-10-21 | Discharge: 2019-10-21 | Disposition: A | Discharge status: Home / Self Care | Payer: Medicare Other | Attending: Vascular Surgery | Admitting: Vascular Surgery

## 2019-10-21 ENCOUNTER — Encounter (HOSPITAL_COMMUNITY)
Admission: RE | Disposition: A | Discharge status: Home / Self Care | Payer: Self-pay | Source: Home / Self Care | Attending: Vascular Surgery

## 2019-10-21 DIAGNOSIS — N185 Chronic kidney disease, stage 5: Secondary | ICD-10-CM

## 2019-10-21 DIAGNOSIS — I120 Hypertensive chronic kidney disease with stage 5 chronic kidney disease or end stage renal disease: Secondary | ICD-10-CM | POA: Insufficient documentation

## 2019-10-21 DIAGNOSIS — Z6835 Body mass index (BMI) 35.0-35.9, adult: Secondary | ICD-10-CM | POA: Insufficient documentation

## 2019-10-21 DIAGNOSIS — N186 End stage renal disease: Secondary | ICD-10-CM | POA: Diagnosis not present

## 2019-10-21 DIAGNOSIS — E1122 Type 2 diabetes mellitus with diabetic chronic kidney disease: Secondary | ICD-10-CM | POA: Insufficient documentation

## 2019-10-21 DIAGNOSIS — Z452 Encounter for adjustment and management of vascular access device: Secondary | ICD-10-CM | POA: Insufficient documentation

## 2019-10-21 DIAGNOSIS — Z992 Dependence on renal dialysis: Secondary | ICD-10-CM | POA: Diagnosis not present

## 2019-10-21 DIAGNOSIS — E669 Obesity, unspecified: Secondary | ICD-10-CM | POA: Diagnosis not present

## 2019-10-21 HISTORY — PX: BASCILIC VEIN TRANSPOSITION: SHX5742

## 2019-10-21 HISTORY — DX: Personal history of other endocrine, nutritional and metabolic disease: Z86.39

## 2019-10-21 HISTORY — DX: Depression, unspecified: F32.A

## 2019-10-21 HISTORY — DX: End stage renal disease: Z99.2

## 2019-10-21 HISTORY — DX: Polyneuropathy, unspecified: G62.9

## 2019-10-21 HISTORY — DX: End stage renal disease: N18.6

## 2019-10-21 HISTORY — DX: Headache, unspecified: R51.9

## 2019-10-21 HISTORY — DX: Constipation, unspecified: K59.00

## 2019-10-21 LAB — POCT I-STAT, CHEM 8
BUN: 47 mg/dL — ABNORMAL HIGH (ref 6–20)
Calcium, Ion: 0.98 mmol/L — ABNORMAL LOW (ref 1.15–1.40)
Chloride: 102 mmol/L (ref 98–111)
Creatinine, Ser: 6.4 mg/dL — ABNORMAL HIGH (ref 0.44–1.00)
Glucose, Bld: 135 mg/dL — ABNORMAL HIGH (ref 70–99)
HCT: 37 % (ref 36.0–46.0)
Hemoglobin: 12.6 g/dL (ref 12.0–15.0)
Potassium: 4.4 mmol/L (ref 3.5–5.1)
Sodium: 143 mmol/L (ref 135–145)
TCO2: 27 mmol/L (ref 22–32)

## 2019-10-21 LAB — GLUCOSE, CAPILLARY
Glucose-Capillary: 122 mg/dL — ABNORMAL HIGH (ref 70–99)
Glucose-Capillary: 161 mg/dL — ABNORMAL HIGH (ref 70–99)

## 2019-10-21 SURGERY — TRANSPOSITION, VEIN, BASILIC
Anesthesia: General | Laterality: Left

## 2019-10-21 MED ORDER — FENTANYL CITRATE (PF) 100 MCG/2ML IJ SOLN: 25.0000 ug | INTRAMUSCULAR | Status: DC | PRN

## 2019-10-21 MED ORDER — CIPROFLOXACIN IN D5W 400 MG/200ML IV SOLN
400.0000 mg | INTRAVENOUS | Status: AC
  Administered 2019-10-21: 400 mg via INTRAVENOUS
  Filled 2019-10-21: qty 200

## 2019-10-21 MED ORDER — SODIUM CHLORIDE 0.9 % IV SOLN
INTRAVENOUS | Status: DC | PRN
  Administered 2019-10-21: 500 mL

## 2019-10-21 MED ORDER — PROPOFOL 10 MG/ML IV BOLUS
INTRAVENOUS | Status: DC | PRN
  Administered 2019-10-21: 30 mg via INTRAVENOUS
  Administered 2019-10-21: 130 mg via INTRAVENOUS

## 2019-10-21 MED ORDER — HEPARIN SODIUM (PORCINE) 1000 UNIT/ML IJ SOLN
1600.0000 [IU] | Freq: Once | INTRAMUSCULAR | Status: AC
  Administered 2019-10-21: 1600 [IU] via INTRAVENOUS
  Filled 2019-10-21: qty 1.6

## 2019-10-21 MED ORDER — MIDAZOLAM HCL 2 MG/2ML IJ SOLN
INTRAMUSCULAR | Status: AC
  Filled 2019-10-21: qty 2

## 2019-10-21 MED ORDER — SODIUM CHLORIDE 0.9 % IV SOLN
INTRAVENOUS | Status: AC
  Filled 2019-10-21: qty 1.2

## 2019-10-21 MED ORDER — FENTANYL CITRATE (PF) 100 MCG/2ML IJ SOLN
INTRAMUSCULAR | Status: DC | PRN
  Administered 2019-10-21 (×2): 50 ug via INTRAVENOUS

## 2019-10-21 MED ORDER — 0.9 % SODIUM CHLORIDE (POUR BTL) OPTIME
TOPICAL | Status: DC | PRN
  Administered 2019-10-21: 1000 mL

## 2019-10-21 MED ORDER — MIDAZOLAM HCL 5 MG/5ML IJ SOLN
INTRAMUSCULAR | Status: DC | PRN
  Administered 2019-10-21: 2 mg via INTRAVENOUS

## 2019-10-21 MED ORDER — CHLORHEXIDINE GLUCONATE 0.12 % MT SOLN
OROMUCOSAL | Status: AC
  Administered 2019-10-21: 15 mL
  Filled 2019-10-21: qty 15

## 2019-10-21 MED ORDER — OXYCODONE-ACETAMINOPHEN 5-325 MG PO TABS
1.0000 | ORAL_TABLET | Freq: Once | ORAL | Status: AC
  Administered 2019-10-21: 1 via ORAL
  Filled 2019-10-21: qty 1

## 2019-10-21 MED ORDER — LIDOCAINE 2% (20 MG/ML) 5 ML SYRINGE
INTRAMUSCULAR | Status: DC | PRN
  Administered 2019-10-21: 80 mg via INTRAVENOUS

## 2019-10-21 MED ORDER — PHENYLEPHRINE HCL (PRESSORS) 10 MG/ML IV SOLN
INTRAVENOUS | Status: DC | PRN
  Administered 2019-10-21 (×2): 80 ug via INTRAVENOUS

## 2019-10-21 MED ORDER — PROPOFOL 10 MG/ML IV BOLUS
INTRAVENOUS | Status: AC
  Filled 2019-10-21: qty 20

## 2019-10-21 MED ORDER — ONDANSETRON HCL 4 MG/2ML IJ SOLN: 4.0000 mg | Freq: Once | INTRAMUSCULAR | Status: DC | PRN

## 2019-10-21 MED ORDER — SODIUM CHLORIDE 0.9 % IV SOLN: INTRAVENOUS | Status: DC

## 2019-10-21 MED ORDER — OXYCODONE-ACETAMINOPHEN 5-325 MG PO TABS
ORAL_TABLET | ORAL | Status: AC
  Filled 2019-10-21: qty 1

## 2019-10-21 MED ORDER — CHLORHEXIDINE GLUCONATE 4 % EX LIQD: 60.0000 mL | Freq: Once | CUTANEOUS | Status: DC

## 2019-10-21 MED ORDER — OXYCODONE-ACETAMINOPHEN 5-325 MG PO TABS: 1.0000 | ORAL_TABLET | ORAL | 0 refills | Status: DC | PRN

## 2019-10-21 MED ORDER — ACETAMINOPHEN 500 MG PO TABS
1000.0000 mg | ORAL_TABLET | Freq: Once | ORAL | Status: AC
  Administered 2019-10-21: 1000 mg via ORAL
  Filled 2019-10-21: qty 2

## 2019-10-21 MED ORDER — FENTANYL CITRATE (PF) 250 MCG/5ML IJ SOLN
INTRAMUSCULAR | Status: AC
  Filled 2019-10-21: qty 5

## 2019-10-21 MED ORDER — ONDANSETRON HCL 4 MG/2ML IJ SOLN
INTRAMUSCULAR | Status: DC | PRN
  Administered 2019-10-21: 4 mg via INTRAVENOUS

## 2019-10-21 SURGICAL SUPPLY — 30 items
ARMBAND PINK RESTRICT EXTREMIT (MISCELLANEOUS) ×2 IMPLANT
CANISTER SUCT 3000ML PPV (MISCELLANEOUS) ×2 IMPLANT
CLIP VESOCCLUDE MED 24/CT (CLIP) IMPLANT
CLIP VESOCCLUDE MED 6/CT (CLIP) IMPLANT
CLIP VESOCCLUDE SM WIDE 24/CT (CLIP) IMPLANT
CLIP VESOCCLUDE SM WIDE 6/CT (CLIP) IMPLANT
COVER PROBE W GEL 5X96 (DRAPES) ×2 IMPLANT
COVER WAND RF STERILE (DRAPES) IMPLANT
DERMABOND ADVANCED (GAUZE/BANDAGES/DRESSINGS) ×1
DERMABOND ADVANCED .7 DNX12 (GAUZE/BANDAGES/DRESSINGS) ×1 IMPLANT
ELECT REM PT RETURN 9FT ADLT (ELECTROSURGICAL) ×2
ELECTRODE REM PT RTRN 9FT ADLT (ELECTROSURGICAL) ×1 IMPLANT
GLOVE BIO SURGEON STRL SZ7.5 (GLOVE) ×2 IMPLANT
GOWN STRL REUS W/ TWL LRG LVL3 (GOWN DISPOSABLE) ×2 IMPLANT
GOWN STRL REUS W/ TWL XL LVL3 (GOWN DISPOSABLE) ×1 IMPLANT
GOWN STRL REUS W/TWL LRG LVL3 (GOWN DISPOSABLE) ×2
GOWN STRL REUS W/TWL XL LVL3 (GOWN DISPOSABLE) ×1
KIT BASIN OR (CUSTOM PROCEDURE TRAY) ×2 IMPLANT
KIT TURNOVER KIT B (KITS) ×2 IMPLANT
NS IRRIG 1000ML POUR BTL (IV SOLUTION) ×2 IMPLANT
PACK CV ACCESS (CUSTOM PROCEDURE TRAY) ×2 IMPLANT
PAD ARMBOARD 7.5X6 YLW CONV (MISCELLANEOUS) ×4 IMPLANT
SUT MNCRL AB 4-0 PS2 18 (SUTURE) ×4 IMPLANT
SUT PROLENE 6 0 BV (SUTURE) ×2 IMPLANT
SUT SILK 2 0 SH (SUTURE) IMPLANT
SUT VIC AB 3-0 SH 27 (SUTURE) ×2
SUT VIC AB 3-0 SH 27X BRD (SUTURE) ×2 IMPLANT
TOWEL GREEN STERILE (TOWEL DISPOSABLE) ×2 IMPLANT
UNDERPAD 30X36 HEAVY ABSORB (UNDERPADS AND DIAPERS) ×2 IMPLANT
WATER STERILE IRR 1000ML POUR (IV SOLUTION) ×2 IMPLANT

## 2019-10-21 NOTE — Transfer of Care (Signed)
Immediate Anesthesia Transfer of Care Note  Patient: Kaitlyn Le  Procedure(s) Performed: LEFT SECOND STAGE BASILIC VEIN TRANSPOSITION (Left )  Patient Location: PACU  Anesthesia Type:General  Level of Consciousness: awake, alert  and oriented  Airway & Oxygen Therapy: Patient Spontanous Breathing and Patient connected to face mask oxygen  Post-op Assessment: Report given to RN and Post -op Vital signs reviewed and stable  Post vital signs: Reviewed and stable  Last Vitals:  Vitals Value Taken Time  BP    Temp    Pulse 74 10/21/19 1201  Resp    SpO2 100 % 10/21/19 1201  Vitals shown include unvalidated device data.  Last Pain:  Vitals:   10/21/19 0956  TempSrc:   PainSc: 0-No pain         Complications: No complications documented.

## 2019-10-21 NOTE — Anesthesia Postprocedure Evaluation (Signed)
Anesthesia Post Note  Patient: Kaitlyn Le  Procedure(s) Performed: LEFT SECOND STAGE BASILIC VEIN TRANSPOSITION (Left )     Patient location during evaluation: PACU Anesthesia Type: General Level of consciousness: awake and alert, oriented and patient cooperative Pain management: pain level controlled Vital Signs Assessment: post-procedure vital signs reviewed and stable Respiratory status: spontaneous breathing, nonlabored ventilation and respiratory function stable Cardiovascular status: blood pressure returned to baseline and stable Postop Assessment: no apparent nausea or vomiting Anesthetic complications: no   No complications documented.  Last Vitals:  Vitals:   10/21/19 0918 10/21/19 1200  BP: (!) 142/70   Pulse: 72   Resp: 18 12  Temp: 36.7 C (!) 36.3 C  SpO2: 100%     Last Pain:  Vitals:   10/21/19 1200  TempSrc:   PainSc: 0-No pain                 Pervis Hocking

## 2019-10-21 NOTE — Anesthesia Procedure Notes (Signed)
Procedure Name: LMA Insertion Date/Time: 10/21/2019 10:41 AM Performed by: Babs Bertin, CRNA Pre-anesthesia Checklist: Patient identified, Emergency Drugs available, Suction available and Patient being monitored Patient Re-evaluated:Patient Re-evaluated prior to induction Oxygen Delivery Method: Circle System Utilized Preoxygenation: Pre-oxygenation with 100% oxygen Induction Type: IV induction Ventilation: Mask ventilation without difficulty LMA: LMA inserted LMA Size: 4.0 Number of attempts: 1 Airway Equipment and Method: Bite block Placement Confirmation: positive ETCO2 Tube secured with: Tape Dental Injury: Teeth and Oropharynx as per pre-operative assessment

## 2019-10-21 NOTE — Op Note (Signed)
    Patient name: Kaitlyn Le MRN: [id number] DOB: May 19, 1970 Sex: female  10/21/2019 Pre-operative Diagnosis: esrd Post-operative diagnosis:  Same Surgeon:  Erlene Quan C. Donzetta Matters, MD Assistant: Karoline Caldwell, PA Procedure Performed: Left upper extremity second stage basilic vein fistula transposition  Indications: 49 year old female with end-stage renal disease currently dialyzing via catheter.  She has a for stage basilic vein fistula she is now indicated for the second stage.  Findings: Fistula throughout its course had multiple branches did measure up to 1 cm in diameter.  At completion was a very strong thrill and a palpable radial artery pulse at the wrist.  Assistant was required for this case for suction, retraction, anastomotic assistance and suture closure of the wounds.   Procedure:  The patient was identified in the holding area and taken to the operating room where she was placed upon the operative table and LMA anesthesia was induced.  She was sterilely prepped and draped in the left upper extremity usual fashion antibiotics were administered a timeout was called.  We began with longitudinal incision above the antecubitum.  We dissected down to the fistula.  We made 2 separate skip incisions to the level of the axilla.  The entire fistula was dissected free the nerve was protected throughout its course.  Branches were divided between clips and ties.  We marked the fistula for orientation.  We clamped it near the antecubitum and divided it.  We flushed with heparinized saline and clamped it and flushed again.  We then tunneled it laterally.  We again flushed with heparinized saline and reclamped.  We spatulated both ends sewn end-to-end with 6-0 Prolene suture.  Prior to completion we let flushing all directions.  Upon completion there was a very good thrill in the fistula this was confirmed with Doppler there was a strong palpable radial artery pulse at the wrist also confirmed with  Doppler.  Satisfied we obtain hemostasis and the wounds closed in layers with Vicryl and Monocryl.  Dermabond was placed to the level of the skin.  She was awake from anesthesia having tolerated procedure without any complication.  All counts were correct at completion.  EBL: 50 cc   Elma Limas C. Donzetta Matters, MD [facility] Office: [phone] Pager: [phone]

## 2019-10-21 NOTE — Anesthesia Preprocedure Evaluation (Addendum)
Anesthesia Evaluation  Patient identified by MRN, date of birth, ID band Patient awake    Reviewed: Allergy & Precautions, NPO status , Patient's Chart, lab work & pertinent test results  Airway Mallampati: II  TM Distance: >3 FB Neck ROM: Full    Dental no notable dental hx. (+) Teeth Intact, Dental Advisory Given, Chipped, Loose,    Pulmonary neg pulmonary ROS,    Pulmonary exam normal breath sounds clear to auscultation       Cardiovascular hypertension, Pt. on medications Normal cardiovascular exam Rhythm:Regular Rate:Normal     Neuro/Psych  Headaches, PSYCHIATRIC DISORDERS Depression    GI/Hepatic Neg liver ROS, Gastroparesis    Endo/Other  diabetes, Well Controlled, Type 2Obesity BMI 35 Last a1c 5.2  Renal/GU ESRFRenal disease  negative genitourinary   Musculoskeletal negative musculoskeletal ROS (+)   Abdominal (+) + obese,   Peds  Hematology negative hematology ROS (+)   Anesthesia Other Findings   Reproductive/Obstetrics negative OB ROS                            Anesthesia Physical Anesthesia Plan  ASA: III  Anesthesia Plan: General   Post-op Pain Management:    Induction:   PONV Risk Score and Plan: 3 and Ondansetron, Dexamethasone and Treatment may vary due to age or medical condition  Airway Management Planned: LMA  Additional Equipment: None  Intra-op Plan:   Post-operative Plan: Extubation in OR  Informed Consent: I have reviewed the patients History and Physical, chart, labs and discussed the procedure including the risks, benefits and alternatives for the proposed anesthesia with the patient or authorized representative who has indicated his/her understanding and acceptance.     Dental advisory given  Plan Discussed with: CRNA  Anesthesia Plan Comments:        Anesthesia Quick Evaluation

## 2019-10-21 NOTE — H&P (Signed)
      History of Present Illness   Kaitlyn Le is a 49 y.o. year old female who presents for postoperative follow-up for left IJ TDC, ligation of right arm AV fistula and creation of left first stage basilic vein fistula on 0/03/49 by Dr. Donzetta Matters. 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 Intervention was attempted on 07/01/19 to reopen the innominate vein during fistulogram however was unsuccessful despite multiple attempts. The patient's wounds are well healed.  The patient notes no steal symptoms. She says after her right AV fistula was ligated she has had significant improvement in her right upper extremity swelling. Otherwise her right arm is without any steal symptoms as well. She is having some tenderness in her left IJ TDC site but it is otherwise functioning well. She currently dialyzes MWF  Physical Examination      Vitals:   09/17/19 1044  BP: (!) 175/95  Pulse: 78  Resp: 14  Temp: 97.9 F (36.6 C)  TempSrc: Temporal  SpO2: 100%  Weight: 183 lb (83 kg)  Height: 5\' 3"  (1.6 m)   Body mass index is 32.42 kg/m.  left and right arms Incisions in bilateral Antecubitals are well healed, 2+ radial pulses, hand grip is 5/5, sensation in digits is intact. palpable thrill, bruit can be auscultated in left AV fistula. The fistula is not palpable in the left upper arm    +------------+----------+-------------+----------+----------------+  OUTFLOW VEINPSV (cm/s)Diameter (cm)Depth (cm)  Describe    +------------+----------+-------------+----------+----------------+  Prox UA     110    0.76     3.36            +------------+----------+-------------+----------+----------------+  Mid UA     86    0.82     2.55            +------------+----------+-------------+----------+----------------+  Dist UA     175    0.64      1.66  competing branch  +------------+----------+-------------+----------+----------------+  AC Fossa    205    0.62     0.33            +------------+----------+-------------+----------+----------------+  Left AV fistula with good diameter but deep in the left upper arm. Decreased velocity noted in mid upper arm likely secondary to competing branch  Medical Decision Making   Kaitlyn Le is a 49 y.o. year old female who presents s/p left IJ TDC, ligation of right arm AV fistula and creation of left first stage basilic vein fistula on 1/79/15 by Dr. Donzetta Matters. Her left BV fistula is patent and functioning well. The vein is maturing very well with diameter >0.6. The vein is deeper than 0.6 and will need to be transposed for easier access. She is currently on HDMWF. Due to family plans she requests that her transposition be scheduled after July 10th,2021  Patent is without signs or symptoms of steal syndrome  She does not take any blood thinners  She will be scheduled on a non dialysis day  I have scheduled her for a second stage BV fistula transposition with Dr. Servando Snare C. Donzetta Matters, MD [facility] [place] Office: [phone] Pager: [phone]

## 2019-10-21 NOTE — Discharge Instructions (Signed)
[facility]  Discharge Instructions  AV Fistula or Graft Surgery for Dialysis Access  Please refer to the following instructions for your post-procedure care. Your surgeon or physician assistant will discuss any changes with you.  Activity  You may drive the day following your surgery, if you are comfortable and no longer taking prescription pain medication. Resume full activity as the soreness in your incision resolves.  Bathing/Showering  You may shower after you go home. Keep your incision dry for 48 hours. Do not soak in a bathtub, hot tub, or swim until the incision heals completely. You may not shower if you have a hemodialysis catheter.  Incision Care  Clean your incision with mild soap and water after 48 hours. Pat the area dry with a clean towel. You do not need a bandage unless otherwise instructed. Do not apply any ointments or creams to your incision. You may have skin glue on your incision. Do not peel it off. It will come off on its own in about one week. Your arm may swell a bit after surgery. To reduce swelling use pillows to elevate your arm so it is above your heart. Your doctor will tell you if you need to lightly wrap your arm with an ACE bandage.  Diet  Resume your normal diet. There are not special food restrictions following this procedure. In order to heal from your surgery, it is CRITICAL to get adequate nutrition. Your body requires vitamins, minerals, and protein. Vegetables are the best source of vitamins and minerals. Vegetables also provide the perfect balance of protein. Processed food has little nutritional value, so try to avoid this.  Medications  Resume taking all of your medications. If your incision is causing pain, you may take over-the counter pain relievers such as acetaminophen (Tylenol). If you were prescribed a stronger pain medication, please be aware these medications can cause nausea and constipation. Prevent  nausea by taking the medication with a snack or meal. Avoid constipation by drinking plenty of fluids and eating foods with high amount of fiber, such as fruits, vegetables, and grains.  Do not take Tylenol if you are taking prescription pain medications.  Follow up Your surgeon may want to see you in the office following your access surgery. If so, this will be arranged at the time of your surgery.  Please call us immediately for any of the following conditions:  . Increased pain, redness, drainage (pus) from your incision site . Fever of 101 degrees or higher . Severe or worsening pain at your incision site . Hand pain or numbness. .  Reduce your risk of vascular disease:  . Stop smoking. If you would like help, call [facility] at 1-800-QUIT-NOW 579-885-7275) or [place] at [phone]  . Manage your cholesterol . Maintain a desired weight . Control your diabetes . Keep your blood pressure down  Dialysis  It will take several weeks to several months for your new dialysis access to be ready for use. Your surgeon will determine when it is okay to use it. Your nephrologist will continue to direct your dialysis. You can continue to use your Permcath until your new access is ready for use.   10/21/2019 Danne Scardina [id number] 1970/11/23  Surgeon(s): Waynetta Sandy, MD  Procedure(s): LEFT SECOND STAGE BASILIC VEIN TRANSPOSITION   May stick graft immediately   May stick graft on designated area only:   X Do not stick left BV fistula  for 4 weeks  If you have any questions, please call the office at [phone].

## 2019-10-22 ENCOUNTER — Encounter (HOSPITAL_COMMUNITY): Payer: Self-pay | Admitting: Vascular Surgery

## 2019-10-26 ENCOUNTER — Telehealth: Payer: Self-pay

## 2019-10-26 NOTE — Telephone Encounter (Signed)
Pt called with c/o swelling in arm and numbness. Denies any hand pain or numbness. It is localized to arm only. Encouraged her to prop her arm up and elevate it with pillows. We discussed importance of calling us back immediately if numbness and pain of hand occur. Pt verbalized understanding and is due to f/u in a few weeks. Will call us if anything changes/worsens.

## 2019-11-11 ENCOUNTER — Other Ambulatory Visit (HOSPITAL_COMMUNITY)
Admission: RE | Admit: 2019-11-11 | Discharge: 2019-11-11 | Disposition: A | Discharge status: Home / Self Care | Payer: Medicare Other | Source: Ambulatory Visit | Attending: Family Medicine | Admitting: Family Medicine

## 2019-11-11 ENCOUNTER — Other Ambulatory Visit: Payer: Self-pay | Admitting: Family Medicine

## 2019-11-11 DIAGNOSIS — Z1151 Encounter for screening for human papillomavirus (HPV): Secondary | ICD-10-CM | POA: Diagnosis not present

## 2019-11-11 DIAGNOSIS — Z01411 Encounter for gynecological examination (general) (routine) with abnormal findings: Secondary | ICD-10-CM | POA: Diagnosis present

## 2019-11-12 LAB — CYTOLOGY - PAP
Comment: NEGATIVE
Diagnosis: NEGATIVE
High risk HPV: NEGATIVE

## 2019-11-15 ENCOUNTER — Other Ambulatory Visit: Payer: Self-pay

## 2019-11-15 ENCOUNTER — Ambulatory Visit (INDEPENDENT_AMBULATORY_CARE_PROVIDER_SITE_OTHER): Payer: Self-pay | Admitting: Physician Assistant

## 2019-11-15 VITALS — BP 105/72 | HR 82 | Temp 98.0°F | Resp 20 | Ht 63.0 in | Wt 183.3 lb

## 2019-11-15 DIAGNOSIS — N186 End stage renal disease: Secondary | ICD-10-CM

## 2019-11-15 NOTE — Progress Notes (Signed)
POST OPERATIVE DIALYSIS ACCESS OFFICE NOTE    CC:  F/u for dialysis access surgery  HPI:  This is a 49 y.o. female who is s/p left basilic vein AV fistula creation August 03, 2019 with subsequent transposition on October 21, 2019 by Dr. Donzetta Matters.  The patient is accompanied today by her daughter.  She states they are having some issues with the arterial side of her left tunneled hemodialysis catheter.  They have increased her heparin dose.  She also states dialysis personnel was concerned regarding drainage around her catheter.  The patient denies fever, chills or hand pain or numbness.  She is dialyzing via left IJ tunneled dialysis catheter that was placed by Dr. Donzetta Matters at the time of her first stage basilic vein fistula in April 2021 Dialysis days: Mondays, Wednesdays and Fridays Dialysis center: [facility] on horse [facility]  Allergies  Allergen Reactions  . Penicillins Hives and Other (See Comments)    Did it involve swelling of the face/tongue/throat, SOB, or low BP? Unknown Did it involve sudden or severe rash/hives, skin peeling, or any reaction on the inside of your mouth or nose? Unknown Did you need to seek medical attention at a hospital or doctor's office? Unknown When did it last happen?childhood reaction. If all above answers are "NO", may proceed with cephalosporin use.     Current Outpatient Medications  Medication Sig Dispense Refill  . amLODipine (NORVASC) 10 MG tablet Take 10 mg by mouth daily.    Marland Kitchen aspirin EC 81 MG tablet Take 81 mg by mouth daily.    . B Complex-C-Zn-Folic Acid (DIALYVITE 093-ATFT 15) 0.8 MG TABS Take 1 tablet by mouth daily.    . carvedilol (COREG) 12.5 MG tablet Take 12.5 mg by mouth 2 (two) times daily with a meal.    . cinacalcet (SENSIPAR) 30 MG tablet Take 30 mg by mouth daily.    Marland Kitchen doxercalciferol (HECTOROL) 4 MCG/2ML injection Doxercalciferol (Hectorol)    . DULoxetine (CYMBALTA) 60 MG capsule Take 60 mg by mouth daily.    . furosemide  (LASIX) 40 MG tablet Take 40 mg by mouth daily.    . heparin 1000 unit/mL SOLN injection Heparin Sodium (Porcine) 1,000 Units/mL Catheter Lock Arterial    . ibuprofen (ADVIL) 200 MG tablet Take 400 mg by mouth every 6 (six) hours as needed for moderate pain.    . iron sucrose in sodium chloride 0.9 % 100 mL Iron Sucrose (Venofer)    . Lactulose 20 GM/30ML SOLN Take 15 mLs by mouth daily as needed (constipation.).     Marland Kitchen losartan (COZAAR) 100 MG tablet Take 100 mg by mouth daily.    Marland Kitchen lovastatin (MEVACOR) 40 MG tablet Take 40 mg by mouth at bedtime.    . Methoxy PEG-Epoetin Beta (MIRCERA IJ) Mircera    . sevelamer carbonate (RENVELA) 800 MG tablet Take 1,600-2,400 mg by mouth See admin instructions. Take 3 tablets (2400 mg) by mouth with each meal & take 2 tablets (1600 mg) by mouth with each snack    . Vitamin D, Ergocalciferol, (DRISDOL) 1.25 MG (50000 UNIT) CAPS capsule Take 50,000 Units by mouth every Thursday.      No current facility-administered medications for this visit.     ROS:  See HPI  BP 105/72 (BP Location: Right Arm, Patient Position: Sitting, Cuff Size: Normal)   Pulse 82   Temp 98 F (36.7 C) (Temporal)   Resp 20   Ht 5\' 3"  (1.6 m)   Wt 183  lb 4.8 oz (83.1 kg)   SpO2 99%   BMI 32.47 kg/m    Physical Exam:  General appearance: Developed well-nourished female in no apparent distress Cardiac: Heart rate and rhythm are regular Respiratory: Nonlabored Incision: Her left upper arm incisions are all healing without signs of infection. Extremities: She has a 2+ left radial pulse.  5 out of 5 hand grip strength.  Sensation intact.  Her fistula is palpable along its course with good thrill and bruit.  I removed and replaced the dressing over her TDC.  I palpated along the tunneled portion of the catheter.  There was no drainage, no erythema or tenderness.  Assessment/Plan:   -pt does not have evidence of steal syndrome -I do not see evidence of line infection -the  fistula/graft may be used starting immediately -I explained that once her fistula has been accessed and treatment is satisfactory, her nephrology team will notify us for arrangements to remove her TDC.  Barbie Banner, PA-C 11/15/2019 12:14 PM [facility] [phone]  Clinic MD: Trula Slade

## 2019-11-16 ENCOUNTER — Other Ambulatory Visit: Payer: Self-pay | Admitting: Family Medicine

## 2019-11-16 DIAGNOSIS — E2839 Other primary ovarian failure: Secondary | ICD-10-CM

## 2019-11-18 ENCOUNTER — Other Ambulatory Visit: Payer: Self-pay | Admitting: Family Medicine

## 2019-11-18 DIAGNOSIS — Z1231 Encounter for screening mammogram for malignant neoplasm of breast: Secondary | ICD-10-CM

## 2019-11-23 ENCOUNTER — Ambulatory Visit: Payer: Medicare Other | Admitting: Podiatry

## 2019-11-26 DIAGNOSIS — Z794 Long term (current) use of insulin: Secondary | ICD-10-CM | POA: Insufficient documentation

## 2019-11-26 DIAGNOSIS — I10 Essential (primary) hypertension: Secondary | ICD-10-CM | POA: Insufficient documentation

## 2019-11-26 DIAGNOSIS — E1121 Type 2 diabetes mellitus with diabetic nephropathy: Secondary | ICD-10-CM | POA: Insufficient documentation

## 2019-12-07 ENCOUNTER — Ambulatory Visit
Admission: RE | Admit: 2019-12-07 | Discharge: 2019-12-07 | Disposition: A | Discharge status: Home / Self Care | Payer: Medicare Other | Source: Ambulatory Visit | Attending: Family Medicine | Admitting: Family Medicine

## 2019-12-07 ENCOUNTER — Other Ambulatory Visit: Payer: Self-pay

## 2019-12-07 DIAGNOSIS — Z1231 Encounter for screening mammogram for malignant neoplasm of breast: Secondary | ICD-10-CM

## 2019-12-21 ENCOUNTER — Other Ambulatory Visit: Payer: Self-pay | Admitting: Gastroenterology

## 2020-01-18 ENCOUNTER — Encounter (INDEPENDENT_AMBULATORY_CARE_PROVIDER_SITE_OTHER): Payer: Medicare Other | Admitting: Ophthalmology

## 2020-01-18 ENCOUNTER — Other Ambulatory Visit: Payer: Self-pay

## 2020-01-18 DIAGNOSIS — I10 Essential (primary) hypertension: Secondary | ICD-10-CM

## 2020-01-18 DIAGNOSIS — H43813 Vitreous degeneration, bilateral: Secondary | ICD-10-CM

## 2020-01-18 DIAGNOSIS — E113313 Type 2 diabetes mellitus with moderate nonproliferative diabetic retinopathy with macular edema, bilateral: Secondary | ICD-10-CM

## 2020-01-18 DIAGNOSIS — E11311 Type 2 diabetes mellitus with unspecified diabetic retinopathy with macular edema: Secondary | ICD-10-CM

## 2020-01-18 DIAGNOSIS — H35033 Hypertensive retinopathy, bilateral: Secondary | ICD-10-CM

## 2020-01-31 ENCOUNTER — Other Ambulatory Visit (HOSPITAL_COMMUNITY)
Admission: RE | Admit: 2020-01-31 | Discharge: 2020-01-31 | Disposition: A | Discharge status: Home / Self Care | Payer: Medicare Other | Source: Ambulatory Visit | Attending: Gastroenterology | Admitting: Gastroenterology

## 2020-01-31 DIAGNOSIS — Z20822 Contact with and (suspected) exposure to covid-19: Secondary | ICD-10-CM | POA: Insufficient documentation

## 2020-01-31 DIAGNOSIS — Z01812 Encounter for preprocedural laboratory examination: Secondary | ICD-10-CM | POA: Diagnosis present

## 2020-01-31 LAB — SARS CORONAVIRUS 2 (TAT 6-24 HRS): SARS Coronavirus 2: NEGATIVE

## 2020-02-02 NOTE — H&P (Signed)
History of Present Illness  General:          49 year old female was referred for gastroparesis.        Her comorbidities include end-stage renal disease on hemodialysis Monday Wednesday Friday, DM, dyslipidemia, hypertension.        Review of labs on epic from 10/21/19 showed elevated creatinine of 6.4, hemoglobin 12.6, elevated blood sugar of 135, normal electrolytes.        SHe was diagnosed with gatroparesis about 7 years ago, at an outside facility.        She reports having an EGD and colonsocopy in 2014 at [place], small bowel pillcam study as well, she was advised to take amitiza(did not work),linzess(insurance would not pay for it), she was given smaples of 72 mcg a day for a long time with partial relief of constipation, but she had loose stools all the time, she was subsequently advised to take linzess 72 mcg every other day. SHe was switched to lactulose as needed when she started HD 2 years.        She has noticed with with consumption of red meat and nuts, she has been more symptomatic-she gets constipated.        She has seen a dietician at [facility], [place].        She tried eating salad for lunch daily for 1 week but it worsened her constipation.        She may not have a Bm for 1 week and feels sensation of incomplete evacuation, stools are hard, denies blood in stool or black stools.        She has lower abdomen when she gets very constipated.        She is intentionally trying to lose weight, about 5 lbs in 30 days, she has a poor appetite. She eats 2 meals a day at most.        She has a lot of bloating, she has early satiety.        Denies difficulty or pain on swallowing.        Denies acid reflux or heartburn.        There is no family history of colon cancer.        She has tried miralax, OTC dulcolax, stool softners, lactulose.        She has not tried pysllium husk, benefiber or metamucil.        She is being worked up for renal transplant and needs an updated  colonoscopy.    Current Medications  Taking   MiraLax(Polyethylene Glycol 3350) 17 GM Packet 1 packet mixed with 8 ounces of fluid Orally Once a day, Notes: PRN  amLODIPine Besylate 5 MG Tablet 2 tablets Orally Once a day  Aspirin 81 MG Tablet Delayed Release 1 tablet Orally Once a day  Carvedilol 12.5 MG Tablet 1 tablet with food Orally Twice a day  Cymbalta(DULoxetine HCl) 60 MG Capsule Delayed Release Particles 1 capsule Orally Once a day  Lactulose 10 GM/15ML Solution 15 ml Orally once a day if needed  Lasix(Furosemide) 40 MG Tablet 1 tablet Orally Once a day  Losartan Potassium 100 MG Tablet 1 tablet Orally Once a day  Lovastatin 40 MG Tablet 1 tablet with the evening meal Orally Once a day  Multivitamin - Tablet 1 tablet Orally Once a day  Renvela(Sevelamer Carbonate) 800 MG Tablet 3 tablets with meals and 1 tablet with snacks Orally as directed  Sensipar(Cinacalcet HCl) 30 MG Tablet 1  tablet with food or after a meal Orally Once a day  Vitamin D3 1.25 MG (50000 UT) Capsule 1 capsule Orally Once a week  Medication List reviewed and reconciled with the patient   Past Medical History  Type 2 diabetes.   End stage renal disease.    Surgical History  c-section 1994  bilateral breast reduction 2000  dialysis cath placed 12/2017  Right fistula created 02/2018  right fistula ligated, left cath placed, left fistula created 07/2019  left fistula - stage 2 10/2019  Left laserr eye surgery 12/20/2019   Family History  Father: deceased 65 yrs, Died from injuries in MVA  Mother: deceased 51 yrs, Died of Renal Failure, diagnosed with Diabetes, Hypertension  1 daughter(s) .   Only Child.   Social History  General:  Tobacco use  cigarettes: Never smoked Tobacco history last updated 12/21/2019 Vaping No no Alcohol.  Caffeine: yes, sweet tea 1 x daily.  no Recreational drug use.  DIET: no beef or pork.  Marital Status: Divorced.  Children: girls - 1.  OCCUPATION: disabled.     Allergies  Penicillin: childhood - Allergy  scallops: hives - Allergy   Hospitalization/Major Diagnostic Procedure  None 12/2019   Review of Systems  GI PROCEDURE:  no Pacemaker/ AICD, no. no Artificial heart valves. no MI/heart attack. no Abnormal heart rhythm. no Angina. no CVA. Hypertension YES. Hypotension YES, due to dialysis. no Asthma, COPD. no Sleep apnea. no Seizure disorders. no Artificial joints. no Severe DJD. Diabetes YES, YES, type II. no Significant headaches. no Vertigo. no Depression/anxiety. no Abnormal bleeding. Kidney Disease yes,  End stage . no Liver disease, no. no Chance of pregnancy. no Blood transfusion. no Method of Birth Control. no Birth control pills.    Vital Signs  Wt 186.4, Wt change 2.6 lb, Ht 61.5, BMI 34.65, Temp 97.9, Pulse sitting 73, BP sitting 129/78 la.  Examination  Gastroenterology::       GENERAL APPEARANCE: Well developed, well nourished, no active distress, pleasant.        SCLERA: anicteric.        CARDIOVASCULAR Normal RRR .        RESPIRATORY Breath sounds normal. Respiration even and unlabored.        ABDOMEN No masses palpated. Liver and spleen not palpated, normal. Bowel sounds normal, Abdomen not distended.        EXTREMITIES: No edema, AVF right arm.        NEURO: alert, oriented to time, place and person, normal gait.        PSYCH: mood/affect normal.   Assessments   1. Screen for colon cancer - Z12.11 (Primary)  2. Bloating - R14.0  3. Constipation, unspecified constipation type - K59.00  4. Type 2 diabetes mellitus with diabetic autonomic (poly)neuropathy - E11.43  5. Gastroparesis - K31.84  6. End stage renal disease - N18.6  7. Dependence on renal dialysis - Z99.2   Treatment   1. Screen for colon cancer        IMAGING: Colonoscopy Notes: Will get records of prior colonoscopy. Patient been evaluated for renal transplant and needs an updated screening colonoscopy. She has had difficulties with IV line placement  before, plans outpatient colonoscopy at [facility], on a day after her dialysis. The risks and benefits of the procedure were discussed with the patient in details.  She understands and verbalizes consent. She will be given written instructions, prescription for preparation and will be scheduled for the same.  Marland Kitchen  2. Bloating        IMAGING: Esophagoscopy This is most likely related to gastroparesis. Recommend diagnostic endoscopy with biopsies for H. pylori and small bowel biopsies to r/o celiac disease.      3. Constipation, unspecified constipation type   Notes: Use of erythromycin as a prokinetic may actually help with constipation.      4. Gastroparesis   Start Erythromycin Tablet, 250 MG, 1 tablet, Orally, Four times a day, 90 days, 360 Tablet, Refills 3      IMAGING: Esophagoscopy              Notes: Advised to start erythromycin to 50 milligrams 4 times a day. Advised a small, frequent meals of, low fiber, low residue diet. Advised patient to incorporate more fluids in her diet, for fruits -recommend taking smoothies, and to take vegetables in a cooked form such as soups.

## 2020-02-03 ENCOUNTER — Ambulatory Visit (HOSPITAL_COMMUNITY)
Admission: RE | Admit: 2020-02-03 | Discharge: 2020-02-03 | Disposition: A | Discharge status: Home / Self Care | Payer: Medicare Other | Attending: Gastroenterology | Admitting: Gastroenterology

## 2020-02-03 ENCOUNTER — Encounter (HOSPITAL_COMMUNITY): Payer: Self-pay | Admitting: Gastroenterology

## 2020-02-03 ENCOUNTER — Ambulatory Visit (HOSPITAL_COMMUNITY): Payer: Medicare Other | Admitting: Anesthesiology

## 2020-02-03 ENCOUNTER — Encounter (HOSPITAL_COMMUNITY)
Admission: RE | Disposition: A | Discharge status: Home / Self Care | Payer: Self-pay | Source: Home / Self Care | Attending: Gastroenterology

## 2020-02-03 DIAGNOSIS — R6881 Early satiety: Secondary | ICD-10-CM | POA: Insufficient documentation

## 2020-02-03 DIAGNOSIS — R140 Abdominal distension (gaseous): Secondary | ICD-10-CM | POA: Insufficient documentation

## 2020-02-03 DIAGNOSIS — K319 Disease of stomach and duodenum, unspecified: Secondary | ICD-10-CM | POA: Diagnosis not present

## 2020-02-03 DIAGNOSIS — K644 Residual hemorrhoidal skin tags: Secondary | ICD-10-CM | POA: Insufficient documentation

## 2020-02-03 DIAGNOSIS — K3184 Gastroparesis: Secondary | ICD-10-CM | POA: Diagnosis not present

## 2020-02-03 DIAGNOSIS — K5730 Diverticulosis of large intestine without perforation or abscess without bleeding: Secondary | ICD-10-CM | POA: Insufficient documentation

## 2020-02-03 DIAGNOSIS — K621 Rectal polyp: Secondary | ICD-10-CM | POA: Diagnosis not present

## 2020-02-03 DIAGNOSIS — D123 Benign neoplasm of transverse colon: Secondary | ICD-10-CM | POA: Insufficient documentation

## 2020-02-03 DIAGNOSIS — Z992 Dependence on renal dialysis: Secondary | ICD-10-CM | POA: Diagnosis not present

## 2020-02-03 DIAGNOSIS — E1143 Type 2 diabetes mellitus with diabetic autonomic (poly)neuropathy: Secondary | ICD-10-CM | POA: Diagnosis not present

## 2020-02-03 DIAGNOSIS — K259 Gastric ulcer, unspecified as acute or chronic, without hemorrhage or perforation: Secondary | ICD-10-CM | POA: Diagnosis not present

## 2020-02-03 DIAGNOSIS — Z1211 Encounter for screening for malignant neoplasm of colon: Secondary | ICD-10-CM | POA: Insufficient documentation

## 2020-02-03 HISTORY — PX: BIOPSY: SHX5522

## 2020-02-03 HISTORY — PX: ESOPHAGOGASTRODUODENOSCOPY (EGD) WITH PROPOFOL: SHX5813

## 2020-02-03 HISTORY — PX: POLYPECTOMY: SHX5525

## 2020-02-03 HISTORY — PX: COLONOSCOPY WITH PROPOFOL: SHX5780

## 2020-02-03 LAB — POCT I-STAT, CHEM 8
BUN: 31 mg/dL — ABNORMAL HIGH (ref 6–20)
Calcium, Ion: 0.94 mmol/L — ABNORMAL LOW (ref 1.15–1.40)
Chloride: 99 mmol/L (ref 98–111)
Creatinine, Ser: 6.1 mg/dL — ABNORMAL HIGH (ref 0.44–1.00)
Glucose, Bld: 86 mg/dL (ref 70–99)
HCT: 35 % — ABNORMAL LOW (ref 36.0–46.0)
Hemoglobin: 11.9 g/dL — ABNORMAL LOW (ref 12.0–15.0)
Potassium: 3.9 mmol/L (ref 3.5–5.1)
Sodium: 136 mmol/L (ref 135–145)
TCO2: 25 mmol/L (ref 22–32)

## 2020-02-03 SURGERY — COLONOSCOPY WITH PROPOFOL
Anesthesia: Monitor Anesthesia Care

## 2020-02-03 MED ORDER — PROPOFOL 10 MG/ML IV BOLUS
INTRAVENOUS | Status: DC | PRN
  Administered 2020-02-03 (×2): 20 mg via INTRAVENOUS

## 2020-02-03 MED ORDER — PROPOFOL 1000 MG/100ML IV EMUL
INTRAVENOUS | Status: AC
  Filled 2020-02-03: qty 100

## 2020-02-03 MED ORDER — SODIUM CHLORIDE 0.9 % IV SOLN: INTRAVENOUS | Status: DC

## 2020-02-03 MED ORDER — LIDOCAINE 2% (20 MG/ML) 5 ML SYRINGE
INTRAMUSCULAR | Status: DC | PRN
  Administered 2020-02-03: 60 mg via INTRAVENOUS

## 2020-02-03 MED ORDER — PROPOFOL 500 MG/50ML IV EMUL
INTRAVENOUS | Status: DC | PRN
  Administered 2020-02-03: 120 ug/kg/min via INTRAVENOUS

## 2020-02-03 SURGICAL SUPPLY — 24 items

## 2020-02-03 NOTE — Brief Op Note (Signed)
02/03/2020  9:28 AM  PATIENT:  Kaitlyn Le  49 y.o. female  PRE-OPERATIVE DIAGNOSIS:  Screening, Bloating, Gastroparesis  POST-OPERATIVE DIAGNOSIS:  * No post-op diagnosis entered *  PROCEDURE:  Procedure(s): COLONOSCOPY WITH PROPOFOL (N/A) ESOPHAGOGASTRODUODENOSCOPY (EGD) WITH PROPOFOL (N/A) BIOPSY POLYPECTOMY  SURGEON:  Surgeon(s) and Role:    Ronnette Juniper, MD - Primary  PHYSICIAN ASSISTANT:   ASSISTANTS: Elease Etienne Fucs,RN,Lisa Nunn,RN,Allison Townsend,Tech  ANESTHESIA:   MAC  EBL:  0 mL   BLOOD ADMINISTERED:none  DRAINS: none   LOCAL MEDICATIONS USED:  NONE  SPECIMEN:  Biopsy / Limited Resection  DISPOSITION OF SPECIMEN:  PATHOLOGY  COUNTS:  YES  TOURNIQUET:  * No tourniquets in log *  DICTATION: .Dragon Dictation  PLAN OF CARE: Discharge to home after PACU  PATIENT DISPOSITION:  PACU - hemodynamically stable.   Delay start of Pharmacological VTE agent (>24hrs) due to surgical blood loss or risk of bleeding: not applicable

## 2020-02-03 NOTE — Op Note (Signed)
[facility] Patient Name: Kaitlyn Le Procedure Date: 02/03/2020 MRN: [id number] Attending MD: Ronnette Juniper , MD Date of Birth: Mar 30, 1971 CSN: [id number] Age: 49 Admit Type: Outpatient Procedure:                Colonoscopy Indications:              Screening for colorectal malignant neoplasm, Last                            colonoscopy: 2014 Providers:                Ronnette Juniper, MD, Benetta Spar RN, RN, Burtis Junes,                            RN, Tyrone Apple, Technician, Marla Roe,                            CRNA Referring MD:             Sela Hilding, MD Medicines:                Monitored Anesthesia Care Complications:            No immediate complications. Estimated blood loss:                            Minimal. Estimated Blood Loss:     Estimated blood loss was minimal. Procedure:                Pre-Anesthesia Assessment:                           - Prior to the procedure, a History and Physical                            was performed, and patient medications and                            allergies were reviewed. The patient's tolerance of                            previous anesthesia was also reviewed. The risks                            and benefits of the procedure and the sedation                            options and risks were discussed with the patient.                            All questions were answered, and informed consent                            was obtained. Prior Anticoagulants: The patient has                            taken no previous anticoagulant or  antiplatelet                            agents. ASA Grade Assessment: III - A patient with                            severe systemic disease. After reviewing the risks                            and benefits, the patient was deemed in                            satisfactory condition to undergo the procedure.                           After obtaining informed consent,  the colonoscope                            was passed under direct vision. Throughout the                            procedure, the patient's blood pressure, pulse, and                            oxygen saturations were monitored continuously. The                            PCF-H190DL ([id number]) Olympus pediatric colonscope                            was introduced through the anus and advanced to the                            the terminal ileum. The colonoscopy was performed                            without difficulty. The patient tolerated the                            procedure well. The quality of the bowel                            preparation was good. Scope In: 9:01:03 AM Scope Out: 9:17:01 AM Scope Withdrawal Time: 0 hours 10 minutes 7 seconds  Total Procedure Duration: 0 hours 15 minutes 58 seconds  Findings:      Hemorrhoids were found on perianal exam.      Skin tags were found on perianal exam.      Two sessile polyps were found in the transverse colon. The polyps were 3       to 5 mm in size. These polyps were removed with a piecemeal technique       using a cold biopsy forceps. Resection and retrieval were complete.      A 5 mm polyp was found in the hepatic flexure. The polyp was sessile.  The polyp was removed with a piecemeal technique using a cold biopsy       forceps. Resection and retrieval were complete.      A few small-mouthed diverticula were found in the transverse colon and       hepatic flexure.      The terminal ileum appeared normal.      A 4 mm polyp was found in the rectum. The polyp was sessile. The polyp       was removed with a piecemeal technique using a cold biopsy forceps.       Resection and retrieval were complete.      The exam was otherwise without abnormality on direct and retroflexion       views. Impression:               - Hemorrhoids found on perianal exam.                           - Perianal skin tags found on perianal exam.                            - Two 3 to 5 mm polyps in the transverse colon,                            removed piecemeal using a cold biopsy forceps.                            Resected and retrieved.                           - One 5 mm polyp at the hepatic flexure, removed                            piecemeal using a cold biopsy forceps. Resected and                            retrieved.                           - Diverticulosis in the transverse colon and at the                            hepatic flexure.                           - The examined portion of the ileum was normal.                           - One 4 mm polyp in the rectum, removed piecemeal                            using a cold biopsy forceps. Resected and retrieved.                           - The examination was otherwise normal on direct  and retroflexion views. Moderate Sedation:      Patient did not receive moderate sedation for this procedure, but       instead received monitored anesthesia care. Recommendation:           - Patient has a contact number available for                            emergencies. The signs and symptoms of potential                            delayed complications were discussed with the                            patient. Return to normal activities tomorrow.                            Written discharge instructions were provided to the                            patient.                           - Resume regular diet.                           - Continue present medications.                           - Await pathology results.                           - Repeat colonoscopy for surveillance based on                            pathology results. Procedure Code(s):        --- Professional ---                           [phone], Colonoscopy, flexible; with biopsy, single                            or multiple Diagnosis Code(s):        --- Professional ---                            Z12.11, Encounter for screening for malignant                            neoplasm of colon                           K63.5, Polyp of colon                           K62.1, Rectal polyp                           K64.9, Unspecified hemorrhoids  K64.4, Residual hemorrhoidal skin tags                           K57.30, Diverticulosis of large intestine without                            perforation or abscess without bleeding CPT copyright 2019 American Medical Association. All rights reserved. The codes documented in this report are preliminary and upon coder review may  be revised to meet current compliance requirements. Ronnette Juniper, MD 02/03/2020 9:27:48 AM This report has been signed electronically. Number of Addenda: 0

## 2020-02-03 NOTE — Discharge Instructions (Signed)
Colonoscopy, Adult, Care After This sheet gives you information about how to care for yourself after your procedure. Your health care provider may also give you more specific instructions. If you have problems or questions, contact your health care provider. What can I expect after the procedure? After the procedure, it is common to have:  A small amount of blood in your stool for 24 hours after the procedure.  Some gas.  Mild cramping or bloating of your abdomen. Follow these instructions at home: Eating and drinking   Drink enough fluid to keep your urine pale yellow.  Follow instructions from your health care provider about eating or drinking restrictions.  Resume your normal diet as instructed by your health care provider. Avoid heavy or fried foods that are hard to digest. Activity  Rest as told by your health care provider.  Avoid sitting for a long time without moving. Get up to take short walks every 1-2 hours. This is important to improve blood flow and breathing. Ask for help if you feel weak or unsteady.  Return to your normal activities as told by your health care provider. Ask your health care provider what activities are safe for you. Managing cramping and bloating   Try walking around when you have cramps or feel bloated.  Apply heat to your abdomen as told by your health care provider. Use the heat source that your health care provider recommends, such as a moist heat pack or a heating pad. ? Place a towel between your skin and the heat source. ? Leave the heat on for 20-30 minutes. ? Remove the heat if your skin turns bright red. This is especially important if you are unable to feel pain, heat, or cold. You may have a greater risk of getting burned. General instructions  For the first 24 hours after the procedure: ? Do not drive or use machinery. ? Do not sign important documents. ? Do not drink alcohol. ? Do your regular daily activities at a slower pace  than normal. ? Eat soft foods that are easy to digest.  Take over-the-counter and prescription medicines only as told by your health care provider.  Keep all follow-up visits as told by your health care provider. This is important. Contact a health care provider if:  You have blood in your stool 2-3 days after the procedure. Get help right away if you have:  More than a small spotting of blood in your stool.  Large blood clots in your stool.  Swelling of your abdomen.  Nausea or vomiting.  A fever.  Increasing pain in your abdomen that is not relieved with medicine. Summary  After the procedure, it is common to have a small amount of blood in your stool. You may also have mild cramping and bloating of your abdomen.  For the first 24 hours after the procedure, do not drive or use machinery, sign important documents, or drink alcohol.  Get help right away if you have a lot of blood in your stool, nausea or vomiting, a fever, or increased pain in your abdomen. This information is not intended to replace advice given to you by your health care provider. Make sure you discuss any questions you have with your health care provider. Document Revised: 10/19/2018 Document Reviewed: 10/19/2018 Elsevier Patient Education  2020 Elsevier Inc.  

## 2020-02-03 NOTE — Interval H&P Note (Signed)
History and Physical Interval Note: 49/female with bloating, history of gastroparesis, constipation for EGD and screening colonoscopy.  02/03/2020 8:30 AM  Erlene Quan  has presented today for EGD and colonoscopy, with the diagnosis of Screening, Bloating, Gastroparesis.  The various methods of treatment have been discussed with the patient and family. After consideration of risks, benefits and other options for treatment, the patient has consented to  Procedure(s): COLONOSCOPY WITH PROPOFOL (N/A) ESOPHAGOGASTRODUODENOSCOPY (EGD) WITH PROPOFOL (N/A) as a surgical intervention.  The patient's history has been reviewed, patient examined, no change in status, stable for surgery.  I have reviewed the patient's chart and labs.  Questions were answered to the patient's satisfaction.     Ronnette Juniper

## 2020-02-03 NOTE — Transfer of Care (Signed)
Immediate Anesthesia Transfer of Care Note  Patient: Kaitlyn Le  Procedure(s) Performed: COLONOSCOPY WITH PROPOFOL (N/A ) ESOPHAGOGASTRODUODENOSCOPY (EGD) WITH PROPOFOL (N/A ) BIOPSY POLYPECTOMY  Patient Location: PACU  Anesthesia Type:MAC  Level of Consciousness: sedated  Airway & Oxygen Therapy: Patient Spontanous Breathing and Patient connected to face mask oxygen  Post-op Assessment: Report given to RN and Post -op Vital signs reviewed and stable  Post vital signs: Reviewed and stable  Last Vitals:  Vitals Value Taken Time  BP    Temp    Pulse 78 02/03/20 0924  Resp 12 02/03/20 0924  SpO2 100 % 02/03/20 0924  Vitals shown include unvalidated device data.  Last Pain:  Vitals:   02/03/20 0754  TempSrc: Tympanic  PainSc: 0-No pain         Complications: No complications documented.

## 2020-02-03 NOTE — Anesthesia Postprocedure Evaluation (Signed)
Anesthesia Post Note  Patient: Kaitlyn Le  Procedure(s) Performed: COLONOSCOPY WITH PROPOFOL (N/A ) ESOPHAGOGASTRODUODENOSCOPY (EGD) WITH PROPOFOL (N/A ) BIOPSY POLYPECTOMY     Patient location during evaluation: PACU Anesthesia Type: MAC Level of consciousness: awake and alert Pain management: pain level controlled Vital Signs Assessment: post-procedure vital signs reviewed and stable Respiratory status: spontaneous breathing, nonlabored ventilation, respiratory function stable and patient connected to nasal cannula oxygen Cardiovascular status: stable and blood pressure returned to baseline Postop Assessment: no apparent nausea or vomiting Anesthetic complications: no   No complications documented.  Last Vitals:  Vitals:   02/03/20 0924 02/03/20 0930  BP: (!) 119/56 (!) 132/96  Pulse:  74  Resp: 16 15  Temp:  37.1 C  SpO2: 100% 100%    Last Pain:  Vitals:   02/03/20 0930  TempSrc: Axillary  PainSc: 0-No pain                 Effie Berkshire

## 2020-02-03 NOTE — Anesthesia Preprocedure Evaluation (Addendum)
Anesthesia Evaluation  Patient identified by MRN, date of birth, ID band Patient awake    Reviewed: Allergy & Precautions, NPO status , Patient's Chart, lab work & pertinent test results  Airway Mallampati: II  TM Distance: >3 FB Neck ROM: Full    Dental  (+) Chipped, Dental Advisory Given,    Pulmonary    breath sounds clear to auscultation       Cardiovascular hypertension, Pt. on medications and Pt. on home beta blockers  Rhythm:Regular Rate:Normal     Neuro/Psych  Headaches, PSYCHIATRIC DISORDERS Depression    GI/Hepatic negative GI ROS,   Endo/Other  diabetes  Renal/GU ESRF and DialysisRenal disease     Musculoskeletal negative musculoskeletal ROS (+)   Abdominal Normal abdominal exam  (+)   Peds  Hematology negative hematology ROS (+)   Anesthesia Other Findings   Reproductive/Obstetrics                            Anesthesia Physical Anesthesia Plan  ASA: III  Anesthesia Plan: MAC   Post-op Pain Management:    Induction: Intravenous  PONV Risk Score and Plan: 0 and Propofol infusion  Airway Management Planned: Natural Airway and Simple Face Mask  Additional Equipment: None  Intra-op Plan:   Post-operative Plan:   Informed Consent: I have reviewed the patients History and Physical, chart, labs and discussed the procedure including the risks, benefits and alternatives for the proposed anesthesia with the patient or authorized representative who has indicated his/her understanding and acceptance.       Plan Discussed with: CRNA  Anesthesia Plan Comments: (EKG: normal sinus rhythm. )       Anesthesia Quick Evaluation

## 2020-02-03 NOTE — Op Note (Signed)
[facility] Patient Name: Kaitlyn Le Procedure Date: 02/03/2020 MRN: [id number] Attending MD: Ronnette Juniper , MD Date of Birth: 04-04-1971 CSN: [id number] Age: 49 Admit Type: Outpatient Procedure:                Upper GI endoscopy Indications:              Abdominal bloating, history of gastroparesis Providers:                Ronnette Juniper, MD, Benetta Spar RN, RN, Burtis Junes,                            RN, Tyrone Apple, Technician, Marla Roe,                            CRNA Referring MD:             Sela Hilding, MD Medicines:                Monitored Anesthesia Care Complications:            No immediate complications. Estimated blood loss:                            Minimal. Estimated Blood Loss:     Estimated blood loss was minimal. Procedure:                Pre-Anesthesia Assessment:                           - Prior to the procedure, a History and Physical                            was performed, and patient medications and                            allergies were reviewed. The patient's tolerance of                            previous anesthesia was also reviewed. The risks                            and benefits of the procedure and the sedation                            options and risks were discussed with the patient.                            All questions were answered, and informed consent                            was obtained. Prior Anticoagulants: The patient has                            taken no previous anticoagulant or antiplatelet  agents. ASA Grade Assessment: III - A patient with                            severe systemic disease. After reviewing the risks                            and benefits, the patient was deemed in                            satisfactory condition to undergo the procedure.                           After obtaining informed consent, the endoscope was                             passed under direct vision. Throughout the                            procedure, the patient's blood pressure, pulse, and                            oxygen saturations were monitored continuously. The                            GIF-H190 ([id number] Olympus gastroscope was                            introduced through the mouth, and advanced to the                            second part of duodenum. The upper GI endoscopy was                            accomplished without difficulty. The patient                            tolerated the procedure well. Scope In: Scope Out: Findings:      The examined esophagus was normal.      The Z-line was regular and was found 35 cm from the incisors.      A few localized small erosions with no bleeding and no stigmata of       recent bleeding were found in the gastric antrum. Biopsies were taken       with a cold forceps for Helicobacter pylori testing.      The cardia and gastric fundus were normal on retroflexion.      The examined duodenum was normal. Biopsies for histology were taken with       a cold forceps for evaluation of celiac disease. Impression:               - Normal esophagus.                           - Z-line regular, 35 cm from the incisors.                           -  Erosive gastropathy with no bleeding and no                            stigmata of recent bleeding. Biopsied.                           - Normal examined duodenum. Biopsied. Moderate Sedation:      Patient did not receive moderate sedation for this procedure, but       instead received monitored anesthesia care. Recommendation:           - Patient has a contact number available for                            emergencies. The signs and symptoms of potential                            delayed complications were discussed with the                            patient. Return to normal activities tomorrow.                            Written discharge instructions were  provided to the                            patient.                           - Resume regular diet.                           - Continue present medications.                           - Await pathology results. Procedure Code(s):        --- Professional ---                           [phone], Esophagogastroduodenoscopy, flexible,                            transoral; with biopsy, single or multiple Diagnosis Code(s):        --- Professional ---                           K31.89, Other diseases of stomach and duodenum                           R14.0, Abdominal distension (gaseous) CPT copyright 2019 American Medical Association. All rights reserved. The codes documented in this report are preliminary and upon coder review may  be revised to meet current compliance requirements. Ronnette Juniper, MD 02/03/2020 9:22:52 AM This report has been signed electronically. Number of Addenda: 0

## 2020-02-04 ENCOUNTER — Encounter (HOSPITAL_COMMUNITY): Payer: Self-pay | Admitting: Gastroenterology

## 2020-02-04 ENCOUNTER — Other Ambulatory Visit: Payer: Self-pay

## 2020-02-04 LAB — SURGICAL PATHOLOGY

## 2020-02-08 ENCOUNTER — Ambulatory Visit
Admission: RE | Admit: 2020-02-08 | Discharge: 2020-02-08 | Disposition: A | Discharge status: Home / Self Care | Payer: Medicare Other | Source: Ambulatory Visit | Attending: Family Medicine | Admitting: Family Medicine

## 2020-02-08 ENCOUNTER — Other Ambulatory Visit: Payer: Self-pay

## 2020-02-08 DIAGNOSIS — E2839 Other primary ovarian failure: Secondary | ICD-10-CM

## 2020-02-15 ENCOUNTER — Other Ambulatory Visit: Payer: Self-pay

## 2020-02-15 ENCOUNTER — Encounter (INDEPENDENT_AMBULATORY_CARE_PROVIDER_SITE_OTHER): Payer: Medicare Other | Admitting: Ophthalmology

## 2020-02-15 DIAGNOSIS — H43813 Vitreous degeneration, bilateral: Secondary | ICD-10-CM

## 2020-02-15 DIAGNOSIS — I10 Essential (primary) hypertension: Secondary | ICD-10-CM | POA: Diagnosis not present

## 2020-02-15 DIAGNOSIS — E11311 Type 2 diabetes mellitus with unspecified diabetic retinopathy with macular edema: Secondary | ICD-10-CM

## 2020-02-15 DIAGNOSIS — E113313 Type 2 diabetes mellitus with moderate nonproliferative diabetic retinopathy with macular edema, bilateral: Secondary | ICD-10-CM

## 2020-02-15 DIAGNOSIS — H35033 Hypertensive retinopathy, bilateral: Secondary | ICD-10-CM

## 2020-02-18 ENCOUNTER — Encounter: Payer: Self-pay | Admitting: Podiatry

## 2020-02-18 ENCOUNTER — Other Ambulatory Visit: Payer: Self-pay

## 2020-02-18 ENCOUNTER — Ambulatory Visit (INDEPENDENT_AMBULATORY_CARE_PROVIDER_SITE_OTHER): Payer: Medicare Other | Admitting: Podiatry

## 2020-02-18 DIAGNOSIS — M79675 Pain in left toe(s): Secondary | ICD-10-CM

## 2020-02-18 DIAGNOSIS — Z794 Long term (current) use of insulin: Secondary | ICD-10-CM

## 2020-02-18 DIAGNOSIS — E1121 Type 2 diabetes mellitus with diabetic nephropathy: Secondary | ICD-10-CM | POA: Diagnosis not present

## 2020-02-18 DIAGNOSIS — M2041 Other hammer toe(s) (acquired), right foot: Secondary | ICD-10-CM

## 2020-02-18 DIAGNOSIS — M2042 Other hammer toe(s) (acquired), left foot: Secondary | ICD-10-CM

## 2020-02-18 DIAGNOSIS — M79674 Pain in right toe(s): Secondary | ICD-10-CM

## 2020-02-18 DIAGNOSIS — B351 Tinea unguium: Secondary | ICD-10-CM | POA: Diagnosis not present

## 2020-02-18 DIAGNOSIS — E119 Type 2 diabetes mellitus without complications: Secondary | ICD-10-CM

## 2020-02-18 NOTE — Patient Instructions (Addendum)
Hammer Toe  Hammer toe is a change in the shape (a deformity) of your toe. The deformity causes the middle joint of your toe to stay bent. This causes pain, especially when you are wearing shoes. Hammer toe starts gradually. At first, the toe can be straightened. Gradually over time, the deformity becomes stiff and permanent. Early treatments to keep the toe straight may relieve pain. As the deformity becomes stiff and permanent, surgery may be needed to straighten the toe. What are the causes? Hammer toe is caused by abnormal bending of the toe joint that is closest to your foot. It happens gradually over time. This pulls on the muscles and connections (tendons) of the toe joint, making them weak and stiff. It is often related to wearing shoes that are too short or narrow and do not let your toes straighten. What increases the risk? You may be at greater risk for hammer toe if you:  Are female.  Are older.  Wear shoes that are too small.  Wear high-heeled shoes that pinch your toes.  Are a ballet dancer.  Have a second toe that is longer than your big toe (first toe).  Injure your foot or toe.  Have arthritis.  Have a family history of hammer toe.  Have a nerve or muscle disorder. What are the signs or symptoms? The main symptoms of this condition are pain and deformity of the toe. The pain is worse when wearing shoes, walking, or running. Other symptoms may include:  Corns or calluses over the bent part of the toe or between the toes.  Redness and a burning feeling on the toe.  An open sore that forms on the top of the toe.  Not being able to straighten the toe. How is this diagnosed? This condition is diagnosed based on your symptoms and a physical exam. During the exam, your health care provider will try to straighten your toe to see how stiff the deformity is. You may also have tests, such as:  A blood test to check for rheumatoid arthritis.  An X-ray to show how  severe the deformity is. How is this treated? Treatment for this condition will depend on how stiff the deformity is. Surgery is often needed. However, sometimes a hammer toe can be straightened without surgery. Treatments that do not involve surgery include:  Taping the toe into a straightened position.  Using pads and cushions to protect the toe (orthotics).  Wearing shoes that provide enough room for the toes.  Doing toe-stretching exercises at home.  Taking an NSAID to reduce pain and swelling. If these treatments do not help or the toe cannot be straightened, surgery is the next option. The most common surgeries used to straighten a hammer toe include:  Arthroplasty. In this procedure, part of the joint is removed, and that allows the toe to straighten.  Fusion. In this procedure, cartilage between the two bones of the joint is taken out and the bones are fused together into one longer bone.  Implantation. In this procedure, part of the bone is removed and replaced with an implant to let the toe move again.  Flexor tendon transfer. In this procedure, the tendons that curl the toes down (flexor tendons) are repositioned. Follow these instructions at home:  Take over-the-counter and prescription medicines only as told by your health care provider.  Do toe straightening and stretching exercises as told by your health care provider.  Keep all follow-up visits as told by your health care   provider. This is important. How is this prevented?  Wear shoes that give your toes enough room and do not cause pain.  Do not wear high-heeled shoes. Contact a health care provider if:  Your pain gets worse.  Your toe becomes red or swollen.  You develop an open sore on your toe. This information is not intended to replace advice given to you by your health care provider. Make sure you discuss any questions you have with your health care provider. Document Revised: 03/07/2017 Document  Reviewed: 07/19/2015 Elsevier Patient Education  [place].  Diabetes Mellitus and [place] care is an important part of your health, especially when you have diabetes. Diabetes may cause you to have problems because of poor blood flow (circulation) to your feet and legs, which can cause your skin to:  Become thinner and drier.  Break more easily.  Heal more slowly.  Peel and crack. You may also have nerve damage (neuropathy) in your legs and feet, causing decreased feeling in them. This means that you may not notice minor injuries to your feet that could lead to more serious problems. Noticing and addressing any potential problems early is the best way to prevent future foot problems. How to care for your feet Foot hygiene  Wash your feet daily with warm water and mild soap. Do not use hot water. Then, pat your feet and the areas between your toes until they are completely dry. Do not soak your feet as this can dry your skin.  Trim your toenails straight across. Do not dig under them or around the cuticle. File the edges of your nails with an emery board or nail file.  Apply a moisturizing lotion or petroleum jelly to the skin on your feet and to dry, brittle toenails. Use lotion that does not contain alcohol and is unscented. Do not apply lotion between your toes. Shoes and socks  Wear clean socks or stockings every day. Make sure they are not too tight. Do not wear knee-high stockings since they may decrease blood flow to your legs.  Wear shoes that fit properly and have enough cushioning. Always look in your shoes before you put them on to be sure there are no objects inside.  To break in new shoes, wear them for just a few hours a day. This prevents injuries on your feet. Wounds, scrapes, corns, and calluses  Check your feet daily for blisters, cuts, bruises, sores, and redness. If you cannot see the bottom of your feet, use a mirror or ask someone for help.  Do  not cut corns or calluses or try to remove them with medicine.  If you find a minor scrape, cut, or break in the skin on your feet, keep it and the skin around it clean and dry. You may clean these areas with mild soap and water. Do not clean the area with peroxide, alcohol, or iodine.  If you have a wound, scrape, corn, or callus on your foot, look at it several times a day to make sure it is healing and not infected. Check for: ? Redness, swelling, or pain. ? Fluid or blood. ? Warmth. ? Pus or a bad smell. General instructions  Do not cross your legs. This may decrease blood flow to your feet.  Do not use heating pads or hot water bottles on your feet. They may burn your skin. If you have lost feeling in your feet or legs, you may not know this is happening  until it is too late.  Protect your feet from hot and cold by wearing shoes, such as at the beach or on hot pavement.  Schedule a complete foot exam at least once a year (annually) or more often if you have foot problems. If you have foot problems, report any cuts, sores, or bruises to your health care provider immediately. Contact a health care provider if:  You have a medical condition that increases your risk of infection and you have any cuts, sores, or bruises on your feet.  You have an injury that is not healing.  You have redness on your legs or feet.  You feel burning or tingling in your legs or feet.  You have pain or cramps in your legs and feet.  Your legs or feet are numb.  Your feet always feel cold.  You have pain around a toenail. Get help right away if:  You have a wound, scrape, corn, or callus on your foot and: ? You have pain, swelling, or redness that gets worse. ? You have fluid or blood coming from the wound, scrape, corn, or callus. ? Your wound, scrape, corn, or callus feels warm to the touch. ? You have pus or a bad smell coming from the wound, scrape, corn, or callus. ? You have a fever. ? You  have a red line going up your leg. Summary  Check your feet every day for cuts, sores, red spots, swelling, and blisters.  Moisturize feet and legs daily.  Wear shoes that fit properly and have enough cushioning.  If you have foot problems, report any cuts, sores, or bruises to your health care provider immediately.  Schedule a complete foot exam at least once a year (annually) or more often if you have foot problems. This information is not intended to replace advice given to you by your health care provider. Make sure you discuss any questions you have with your health care provider. Document Revised: 12/16/2018 Document Reviewed: 04/26/2016 Elsevier Patient Education  [place]? An infection that lies within the keratin of your nail plate that is caused by a fungus.  WHY ME? Fungal infections affect all ages, sexes, races, and creeds.  There may be many factors that predispose you to a fungal infection such as age, coexisting medical conditions such as diabetes, or an autoimmune disease; stress, medications, fatigue, genetics, etc.  Bottom line: fungus thrives in a warm, moist environment and your shoes offer such a location.  IS IT CONTAGIOUS? Theoretically, yes.  You do not want to share shoes, nail clippers or files with someone who has fungal toenails.  Walking around barefoot in the same room or sleeping in the same bed is unlikely to transfer the organism.  It is important to realize, however, that fungus can spread easily from one nail to the next on the same foot.  HOW DO WE TREAT THIS?  There are several ways to treat this condition.  Treatment may depend on many factors such as age, medications, pregnancy, liver and kidney conditions, etc.  It is best to ask your doctor which options are available to you.  5. No treatment.   Unlike many other medical concerns, you can live with this condition.  However for many people this can  be a painful condition and may lead to ingrown toenails or a bacterial infection.  It is recommended that you keep the nails cut short to help reduce the amount of fungal  nail. 6. Topical treatment.  These range from herbal remedies to prescription strength nail lacquers.  About 40-50% effective, topicals require twice daily application for approximately 9 to 12 months or until an entirely new nail has grown out.  The most effective topicals are medical grade medications available through physicians offices. 7. Oral antifungal medications.  With an 80-90% cure rate, the most common oral medication requires 3 to 4 months of therapy and stays in your system for a year as the new nail grows out.  Oral antifungal medications do require blood work to make sure it is a safe drug for you.  A liver function panel will be performed prior to starting the medication and after the first month of treatment.  It is important to have the blood work performed to avoid any harmful side effects.  In general, this medication safe but blood work is required. 8. Laser Therapy.  This treatment is performed by applying a specialized laser to the affected nail plate.  This therapy is noninvasive, fast, and non-painful.  It is not covered by insurance and is therefore, out of pocket.  The results have been very good with a 80-95% cure rate.  The [facility] is the only practice in the area to offer this therapy. 9. Permanent Nail Avulsion.  Removing the entire nail so that a new nail will not grow back.

## 2020-02-20 NOTE — Progress Notes (Signed)
Subjective: Kaitlyn Le presents today referred by Glenis Smoker, MD for diabetic foot evaluation.  Patient relates 13 year history of diabetes.  Patient denies any history of foot wounds.  Patient does relate some symptoms of numbness in her feet. She takes Cymbalta for this.  She has h/o getting pedicures in the past.  Past Medical History:  Diagnosis Date  . Chronic kidney disease   . Constipation   . Depression    situational  . Diabetes mellitus without complication ([place])   . ESRD (end stage renal disease) on dialysis ([place])   . Gastroparesis   . Headache    migraine - none in 10 years- 10/19/2019  . History of diabetic gastroparesis   . Hyperlipidemia   . Hypertension   . Neuropathy     Patient Active Problem List   Diagnosis Date Noted  . Essential hypertension 11/26/2019  . Type 2 diabetes mellitus with diabetic nephropathy, with long-term current use of insulin ([place]) 11/26/2019  . Allergy status to other drugs, medicaments and biological substances 07/20/2019  . Dyspnea, unspecified 07/20/2019  . Anemia in chronic kidney disease 04/26/2019  . Coagulation defect, unspecified ([place]) 04/26/2019  . Encounter for immunization 04/26/2019  . End stage renal disease ([place]) 04/26/2019  . Secondary hyperparathyroidism of renal origin ([place]) 04/26/2019  . Pruritus, unspecified 04/26/2019  . Personal history of anaphylaxis 04/26/2019  . Unspecified disorder of calcium metabolism 04/26/2019    Past Surgical History:  Procedure Laterality Date  . A/V FISTULAGRAM N/A 07/01/2019   Procedure: A/V FISTULAGRAM;  Surgeon: Waynetta Sandy, MD;  Location: [facility];  Service: Cardiovascular;  Laterality: N/A;  . AV FISTULA PLACEMENT    . AV FISTULA PLACEMENT Left 08/03/2019   Procedure: LEFT BASILIC ARTERIOVENOUS FISTULA CREATION;  Surgeon: Waynetta Sandy, MD;  Location: [facility];  Service: Vascular;  Laterality: Left;  . BASCILIC VEIN  TRANSPOSITION Left 10/21/2019   Procedure: LEFT SECOND STAGE BASILIC VEIN TRANSPOSITION;  Surgeon: Waynetta Sandy, MD;  Location: [facility];  Service: Vascular;  Laterality: Left;  . BIOPSY  02/03/2020   Procedure: BIOPSY;  Surgeon: Ronnette Juniper, MD;  Location: [facility];  Service: Gastroenterology;;  . BREAST REDUCTION SURGERY    . CESAREAN SECTION    . COLONOSCOPY WITH PROPOFOL N/A 02/03/2020   Procedure: COLONOSCOPY WITH PROPOFOL;  Surgeon: Ronnette Juniper, MD;  [facility];  Service: Gastroenterology;  Laterality: N/A;  . ESOPHAGOGASTRODUODENOSCOPY (EGD) WITH PROPOFOL N/A 02/03/2020   Procedure: ESOPHAGOGASTRODUODENOSCOPY (EGD) WITH PROPOFOL;  Surgeon: Ronnette Juniper, MD;  [facility];  Service: Gastroenterology;  Laterality: N/A;  . INSERTION OF DIALYSIS CATHETER Left 08/03/2019   Procedure: INSERTION OF PALINDROME DIALYSIS CATHETER 19CM;  Surgeon: Waynetta Sandy, MD;  Location: [facility];  Service: Vascular;  Laterality: Left;  . LIGATION OF ARTERIOVENOUS  FISTULA Right 08/03/2019   Procedure: LIGATION OF ARTERIOVENOUS  FISTULA;  Surgeon: Waynetta Sandy, MD;  Location: [facility];  Service: Vascular;  Laterality: Right;  . PERIPHERAL VASCULAR BALLOON ANGIOPLASTY  07/01/2019   Procedure: PERIPHERAL VASCULAR BALLOON ANGIOPLASTY;  Surgeon: Waynetta Sandy, MD;  Location: [facility];  Service: Cardiovascular;;  right subclavian vein stenosed unable to cross lesion  . POLYPECTOMY  02/03/2020   Procedure: POLYPECTOMY;  Surgeon: Ronnette Juniper, MD;  Location: [facility];  Service: Gastroenterology;;  . REDUCTION MAMMAPLASTY      Current Outpatient Medications on File Prior to Visit  Medication Sig Dispense Refill  . amLODipine (NORVASC) 5 MG  tablet Take 10 mg by mouth at bedtime.     Marland Kitchen aspirin EC 81 MG tablet Take 81 mg by mouth at bedtime.     . B Complex-C-Zn-Folic Acid (DIALYVITE 683-MHDQ 15) 0.8 MG TABS Take 1 tablet by mouth at bedtime.      . carvedilol (COREG) 12.5 MG tablet Take 12.5 mg by mouth 2 (two) times daily with a meal. 1200 &2100    . cinacalcet (SENSIPAR) 30 MG tablet Take 30 mg by mouth at bedtime.     Marland Kitchen doxercalciferol (HECTOROL) 4 MCG/2ML injection Inject 4 mcg into the vein See admin instructions. dialysis    . DULoxetine (CYMBALTA) 60 MG capsule Take 60 mg by mouth at bedtime.     . furosemide (LASIX) 40 MG tablet Take 40 mg by mouth at bedtime.     . heparin 1000 unit/mL SOLN injection 1,000 Units by Dialysis route one time in dialysis.     Marland Kitchen ibuprofen (ADVIL) 200 MG tablet Take 400 mg by mouth every 6 (six) hours as needed for moderate pain.    . iron sucrose in sodium chloride 0.9 % 100 mL Inject 100 mg into the vein See admin instructions. dialysis    . Lactulose 20 GM/30ML SOLN Take 15 mLs by mouth daily as needed (constipation.).     Marland Kitchen losartan (COZAAR) 100 MG tablet Take 100 mg by mouth at bedtime.     . lovastatin (MEVACOR) 40 MG tablet Take 40 mg by mouth at bedtime.    . Methoxy PEG-Epoetin Beta (MIRCERA IJ) Inject as directed. dialysis    . sevelamer carbonate (RENVELA) 800 MG tablet Take 1,600-2,400 mg by mouth See admin instructions. Take 3 tablets (2400 mg) by mouth with each meal & take 2 tablets (1600 mg) by mouth with each snack    . Vitamin D, Ergocalciferol, (DRISDOL) 1.25 MG (50000 UNIT) CAPS capsule Take 50,000 Units by mouth every Thursday. In the evening     No current facility-administered medications on file prior to visit.     Allergies  Allergen Reactions  . Penicillins Hives and Other (See Comments)    Did it involve swelling of the face/tongue/throat, SOB, or low BP? Unknown Did it involve sudden or severe rash/hives, skin peeling, or any reaction on the inside of your mouth or nose? Unknown Did you need to seek medical attention at a hospital or doctor's office? Unknown When did it last happen?childhood reaction. If all above answers are "NO", may proceed with cephalosporin  use.     Social History   Occupational History  . Not on file  Tobacco Use  . Smoking status: Never Smoker  . Smokeless tobacco: Never Used  Vaping Use  . Vaping Use: Never used  Substance and Sexual Activity  . Alcohol use: Yes    Comment: rarely - special occasion  . Drug use: Not Currently  . Sexual activity: Not on file    History reviewed. No pertinent family history.   There is no immunization history on file for this patient.  Objective: There were no vitals filed for this visit.  Juliane Guest is a pleasant 49 y.o. female in NAD. AAO X 3.  Vascular Examination: Capillary refill time to digits immediate b/l. Palpable pedal pulses b/l LE. Pedal hair present. Lower extremity skin temperature gradient within normal limits. No pain with calf compression b/l.  Dermatological Examination: Pedal skin with normal turgor, texture and tone bilaterally. No open wounds bilaterally. No interdigital macerations bilaterally. Toenails 1-5 b/l  elongated, discolored, dystrophic, thickened, crumbly with subungual debris and tenderness to dorsal palpation. There is noted onchyolysis of nailplates of L hallux and R hallux.  The nailbeds remain intact. There is no erythema, no edema, no drainage, no underlying fluctuance.  Musculoskeletal Examination: Normal muscle strength 5/5 to all lower extremity muscle groups bilaterally. Hammertoes noted to the b/l lower extremities. Patient ambulates independent of any assistive aids.  Footwear Assessment: Does the patient wear appropriate shoes? Yes. Does the patient need inserts/orthotics? No.  Neurological Examination: Pt has subjective symptoms of neuropathy. Protective sensation intact 5/5 intact bilaterally with 10g monofilament b/l. Vibratory sensation intact b/l.  Assessment: 1. Pain due to onychomycosis of toenails of both feet   2. Acquired hammertoes of both feet   3. Type 2 diabetes mellitus with diabetic nephropathy, with  long-term current use of insulin ([place])   4. Encounter for diabetic foot exam ([place])    ADA Risk Categorization: Low Risk:  Patient has all of the following: Intact protective sensation No prior foot ulcer  No severe deformity Pedal pulses present  Plan: -Examined patient. -No new findings. No new orders. -Diabetic foot examination performed on today's visit. -Discussed diabetic foot care principles. Literature dispensed on today. -We did discuss discontinuing getting pedicures. -Patient to continue soft, supportive shoe gear daily. -Toenails 1-5 b/l were debrided in length and girth with sterile nail nippers and dremel without iatrogenic bleeding.  -Patient to report any pedal injuries to medical professional immediately. -Patient/POA to call should there be question/concern in the interim.  Return in about 3 months (around 05/20/2020) for diabetic nail trim.

## 2020-02-28 ENCOUNTER — Other Ambulatory Visit: Payer: Self-pay

## 2020-02-28 ENCOUNTER — Encounter (INDEPENDENT_AMBULATORY_CARE_PROVIDER_SITE_OTHER): Payer: Medicare Other | Admitting: Ophthalmology

## 2020-02-28 DIAGNOSIS — E11311 Type 2 diabetes mellitus with unspecified diabetic retinopathy with macular edema: Secondary | ICD-10-CM

## 2020-02-28 DIAGNOSIS — E113312 Type 2 diabetes mellitus with moderate nonproliferative diabetic retinopathy with macular edema, left eye: Secondary | ICD-10-CM

## 2020-03-07 ENCOUNTER — Other Ambulatory Visit: Payer: Medicare Other | Admitting: Orthotics

## 2020-03-14 ENCOUNTER — Encounter (INDEPENDENT_AMBULATORY_CARE_PROVIDER_SITE_OTHER): Payer: Medicare Other | Admitting: Ophthalmology

## 2020-03-14 ENCOUNTER — Other Ambulatory Visit: Payer: Self-pay

## 2020-03-14 DIAGNOSIS — E11311 Type 2 diabetes mellitus with unspecified diabetic retinopathy with macular edema: Secondary | ICD-10-CM | POA: Diagnosis not present

## 2020-03-14 DIAGNOSIS — H43813 Vitreous degeneration, bilateral: Secondary | ICD-10-CM | POA: Diagnosis not present

## 2020-03-14 DIAGNOSIS — E113311 Type 2 diabetes mellitus with moderate nonproliferative diabetic retinopathy with macular edema, right eye: Secondary | ICD-10-CM | POA: Diagnosis not present

## 2020-03-14 DIAGNOSIS — H35033 Hypertensive retinopathy, bilateral: Secondary | ICD-10-CM | POA: Diagnosis not present

## 2020-03-14 DIAGNOSIS — E113392 Type 2 diabetes mellitus with moderate nonproliferative diabetic retinopathy without macular edema, left eye: Secondary | ICD-10-CM

## 2020-03-14 DIAGNOSIS — I10 Essential (primary) hypertension: Secondary | ICD-10-CM | POA: Diagnosis not present

## 2020-03-21 ENCOUNTER — Other Ambulatory Visit: Payer: Self-pay

## 2020-03-21 ENCOUNTER — Ambulatory Visit: Payer: Medicare Other | Admitting: Orthotics

## 2020-03-21 DIAGNOSIS — M79674 Pain in right toe(s): Secondary | ICD-10-CM

## 2020-03-21 DIAGNOSIS — B351 Tinea unguium: Secondary | ICD-10-CM

## 2020-03-21 DIAGNOSIS — E1121 Type 2 diabetes mellitus with diabetic nephropathy: Secondary | ICD-10-CM

## 2020-03-21 DIAGNOSIS — M2042 Other hammer toe(s) (acquired), left foot: Secondary | ICD-10-CM

## 2020-03-21 NOTE — Progress Notes (Signed)

## 2020-04-11 ENCOUNTER — Encounter (INDEPENDENT_AMBULATORY_CARE_PROVIDER_SITE_OTHER): Payer: Medicare Other | Admitting: Ophthalmology

## 2020-04-14 DIAGNOSIS — U071 COVID-19: Secondary | ICD-10-CM | POA: Insufficient documentation

## 2020-04-14 DIAGNOSIS — Z20822 Contact with and (suspected) exposure to covid-19: Secondary | ICD-10-CM | POA: Insufficient documentation

## 2020-05-15 ENCOUNTER — Other Ambulatory Visit: Payer: Self-pay

## 2020-05-15 ENCOUNTER — Emergency Department (HOSPITAL_COMMUNITY)
Admission: EM | Admit: 2020-05-15 | Discharge: 2020-05-15 | Disposition: A | Discharge status: Home / Self Care | Payer: No Typology Code available for payment source | Attending: Emergency Medicine | Admitting: Emergency Medicine

## 2020-05-15 ENCOUNTER — Encounter (HOSPITAL_COMMUNITY): Payer: Self-pay

## 2020-05-15 ENCOUNTER — Emergency Department (HOSPITAL_COMMUNITY): Payer: No Typology Code available for payment source

## 2020-05-15 DIAGNOSIS — M545 Low back pain, unspecified: Secondary | ICD-10-CM | POA: Diagnosis not present

## 2020-05-15 DIAGNOSIS — Y999 Unspecified external cause status: Secondary | ICD-10-CM | POA: Insufficient documentation

## 2020-05-15 DIAGNOSIS — Y92410 Unspecified street and highway as the place of occurrence of the external cause: Secondary | ICD-10-CM | POA: Insufficient documentation

## 2020-05-15 DIAGNOSIS — E1122 Type 2 diabetes mellitus with diabetic chronic kidney disease: Secondary | ICD-10-CM | POA: Diagnosis not present

## 2020-05-15 DIAGNOSIS — Z79899 Other long term (current) drug therapy: Secondary | ICD-10-CM | POA: Insufficient documentation

## 2020-05-15 DIAGNOSIS — N186 End stage renal disease: Secondary | ICD-10-CM | POA: Diagnosis not present

## 2020-05-15 DIAGNOSIS — I120 Hypertensive chronic kidney disease with stage 5 chronic kidney disease or end stage renal disease: Secondary | ICD-10-CM | POA: Diagnosis not present

## 2020-05-15 DIAGNOSIS — R519 Headache, unspecified: Secondary | ICD-10-CM | POA: Insufficient documentation

## 2020-05-15 DIAGNOSIS — Z794 Long term (current) use of insulin: Secondary | ICD-10-CM | POA: Diagnosis not present

## 2020-05-15 DIAGNOSIS — Z992 Dependence on renal dialysis: Secondary | ICD-10-CM | POA: Diagnosis not present

## 2020-05-15 DIAGNOSIS — M542 Cervicalgia: Secondary | ICD-10-CM | POA: Diagnosis not present

## 2020-05-15 DIAGNOSIS — M546 Pain in thoracic spine: Secondary | ICD-10-CM | POA: Diagnosis not present

## 2020-05-15 DIAGNOSIS — Y9389 Activity, other specified: Secondary | ICD-10-CM | POA: Insufficient documentation

## 2020-05-15 LAB — HEMOGLOBIN AND HEMATOCRIT, BLOOD
HCT: 31.8 % — ABNORMAL LOW (ref 36.0–46.0)
Hemoglobin: 10.5 g/dL — ABNORMAL LOW (ref 12.0–15.0)

## 2020-05-15 MED ORDER — OXYCODONE-ACETAMINOPHEN 5-325 MG PO TABS
1.0000 | ORAL_TABLET | Freq: Once | ORAL | Status: AC
  Administered 2020-05-15: 1 via ORAL
  Filled 2020-05-15: qty 1

## 2020-05-15 MED ORDER — METHOCARBAMOL 500 MG PO TABS
1000.0000 mg | ORAL_TABLET | Freq: Once | ORAL | Status: AC
  Administered 2020-05-15: 1000 mg via ORAL
  Filled 2020-05-15: qty 2

## 2020-05-15 MED ORDER — METHOCARBAMOL 500 MG PO TABS: 500.0000 mg | ORAL_TABLET | Freq: Three times a day (TID) | ORAL | 0 refills | Status: AC | PRN
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
## 2020-05-16 ENCOUNTER — Encounter (INDEPENDENT_AMBULATORY_CARE_PROVIDER_SITE_OTHER): Payer: Medicare Other | Admitting: Ophthalmology

## 2020-05-25 ENCOUNTER — Encounter (INDEPENDENT_AMBULATORY_CARE_PROVIDER_SITE_OTHER): Payer: Medicare Other | Admitting: Ophthalmology

## 2020-05-25 ENCOUNTER — Other Ambulatory Visit: Payer: Self-pay

## 2020-05-25 DIAGNOSIS — I10 Essential (primary) hypertension: Secondary | ICD-10-CM | POA: Diagnosis not present

## 2020-05-25 DIAGNOSIS — E113311 Type 2 diabetes mellitus with moderate nonproliferative diabetic retinopathy with macular edema, right eye: Secondary | ICD-10-CM | POA: Diagnosis not present

## 2020-05-25 DIAGNOSIS — H35033 Hypertensive retinopathy, bilateral: Secondary | ICD-10-CM | POA: Diagnosis not present

## 2020-05-25 DIAGNOSIS — H43813 Vitreous degeneration, bilateral: Secondary | ICD-10-CM

## 2020-05-25 DIAGNOSIS — E113392 Type 2 diabetes mellitus with moderate nonproliferative diabetic retinopathy without macular edema, left eye: Secondary | ICD-10-CM

## 2020-05-30 ENCOUNTER — Other Ambulatory Visit: Payer: Self-pay

## 2020-05-30 ENCOUNTER — Ambulatory Visit (INDEPENDENT_AMBULATORY_CARE_PROVIDER_SITE_OTHER): Payer: Medicare Other | Admitting: Podiatry

## 2020-05-30 ENCOUNTER — Encounter: Payer: Self-pay | Admitting: Podiatry

## 2020-05-30 DIAGNOSIS — M79675 Pain in left toe(s): Secondary | ICD-10-CM

## 2020-05-30 DIAGNOSIS — E1121 Type 2 diabetes mellitus with diabetic nephropathy: Secondary | ICD-10-CM | POA: Diagnosis not present

## 2020-05-30 DIAGNOSIS — L84 Corns and callosities: Secondary | ICD-10-CM

## 2020-05-30 DIAGNOSIS — K5900 Constipation, unspecified: Secondary | ICD-10-CM | POA: Insufficient documentation

## 2020-05-30 DIAGNOSIS — M79674 Pain in right toe(s): Secondary | ICD-10-CM | POA: Diagnosis not present

## 2020-05-30 DIAGNOSIS — K3184 Gastroparesis: Secondary | ICD-10-CM | POA: Insufficient documentation

## 2020-05-30 DIAGNOSIS — Z992 Dependence on renal dialysis: Secondary | ICD-10-CM | POA: Insufficient documentation

## 2020-05-30 DIAGNOSIS — H9190 Unspecified hearing loss, unspecified ear: Secondary | ICD-10-CM | POA: Insufficient documentation

## 2020-05-30 DIAGNOSIS — M2042 Other hammer toe(s) (acquired), left foot: Secondary | ICD-10-CM

## 2020-05-30 DIAGNOSIS — B351 Tinea unguium: Secondary | ICD-10-CM | POA: Diagnosis not present

## 2020-05-30 DIAGNOSIS — H35039 Hypertensive retinopathy, unspecified eye: Secondary | ICD-10-CM | POA: Insufficient documentation

## 2020-05-30 DIAGNOSIS — Z794 Long term (current) use of insulin: Secondary | ICD-10-CM

## 2020-05-30 DIAGNOSIS — E2839 Other primary ovarian failure: Secondary | ICD-10-CM | POA: Insufficient documentation

## 2020-05-30 DIAGNOSIS — M2041 Other hammer toe(s) (acquired), right foot: Secondary | ICD-10-CM

## 2020-05-30 NOTE — Progress Notes (Signed)
Subjective: Kaitlyn Le presents today referred by Kaitlyn Smoker, MD for diabetic foot evaluation. Patient did not check blood glucose this morning.  She would like to know the status of her diabetic shoes.  Her daughter is present during today's visit.  Allergies  Allergen Reactions  . Penicillins Hives and Other (See Comments)    Did it involve swelling of the face/tongue/throat, SOB, or low BP? Unknown Did it involve sudden or severe rash/hives, skin peeling, or any reaction on the inside of your mouth or nose? Unknown Did you need to seek medical attention at a hospital or doctor's office? Unknown When did it last happen?childhood reaction. If all above answers are "NO", may proceed with cephalosporin use.    Objective: There were no vitals filed for this visit.  Kaitlyn Le is a pleasant 50 y.o. female in NAD. AAO X 3.  Vascular Examination: Capillary refill time to digits immediate b/l. Palpable pedal pulses b/l LE. Pedal hair present. Lower extremity skin temperature gradient within normal limits. No pain with calf compression b/l.  Dermatological Examination: Pedal skin with normal turgor, texture and tone bilaterally. No open wounds bilaterally. No interdigital macerations bilaterally. Toenails 1-5 b/l elongated, discolored, dystrophic, thickened, crumbly with subungual debris and tenderness to dorsal palpation. There is noted onchyolysis of nailplates of L hallux and R hallux.  The nailbeds remain intact. There is no erythema, no edema, no drainage, no underlying fluctuance.  Hyperkeratotic lesion submet head 1 b/l. No edema, no erythema, no drainage, no fluctuance.  Musculoskeletal Examination: Normal muscle strength 5/5 to all lower extremity muscle groups bilaterally. Hammertoes noted to the b/l lower extremities. Patient ambulates independent of any assistive aids.   Neurological Examination: Pt has subjective symptoms of neuropathy. Protective  sensation intact 5/5 intact bilaterally with 10g monofilament b/l. Vibratory sensation intact b/l.  Assessment: 1. Pain due to onychomycosis of toenails of both feet   2. Callus   3. Acquired hammertoes of both feet   4. Type 2 diabetes mellitus with diabetic nephropathy, with long-term current use of insulin ([place])    Plan: -Examined patient. -No new findings. No new orders. -Continue diabetic foot care principles. -Patient to continue soft, supportive shoe gear daily. -Pedorthist remeasured patient today for diabetic shoes as her last order was misplaced. She will be contacted when her shoes arrive. -Toenails 1-5 b/l were debrided in length and girth with sterile nail nippers and dremel without iatrogenic bleeding.  -Calluses pared submetatarsal head(s) 1 bilaterally utilizing sterile scalpel blade without incident. -Patient to report any pedal injuries to medical professional immediately. -Patient/POA to call should there be question/concern in the interim.  Return in about 3 months (around 08/27/2020).

## 2020-06-22 ENCOUNTER — Encounter (INDEPENDENT_AMBULATORY_CARE_PROVIDER_SITE_OTHER): Payer: Medicare Other | Admitting: Ophthalmology

## 2020-06-22 ENCOUNTER — Other Ambulatory Visit: Payer: Self-pay

## 2020-06-22 DIAGNOSIS — E113392 Type 2 diabetes mellitus with moderate nonproliferative diabetic retinopathy without macular edema, left eye: Secondary | ICD-10-CM | POA: Diagnosis not present

## 2020-06-22 DIAGNOSIS — H35033 Hypertensive retinopathy, bilateral: Secondary | ICD-10-CM | POA: Diagnosis not present

## 2020-06-22 DIAGNOSIS — I10 Essential (primary) hypertension: Secondary | ICD-10-CM

## 2020-06-22 DIAGNOSIS — E113311 Type 2 diabetes mellitus with moderate nonproliferative diabetic retinopathy with macular edema, right eye: Secondary | ICD-10-CM

## 2020-06-22 DIAGNOSIS — H43813 Vitreous degeneration, bilateral: Secondary | ICD-10-CM

## 2020-07-19 ENCOUNTER — Telehealth: Payer: Self-pay | Admitting: Podiatry

## 2020-07-19 NOTE — Telephone Encounter (Signed)
Pt left message asking about status of diabetic shoes since [place] told her it would be 3 wks in feb and she has not heard anything.  I returned call and left message for pt that we have not received the medicare required documents from her pcp and that is what is holding up the shoes. I refaxed the documents today but it has been faxed multiple times.

## 2020-07-20 ENCOUNTER — Encounter (INDEPENDENT_AMBULATORY_CARE_PROVIDER_SITE_OTHER): Payer: Medicare Other | Admitting: Ophthalmology

## 2020-07-20 ENCOUNTER — Other Ambulatory Visit: Payer: Self-pay

## 2020-07-20 DIAGNOSIS — I10 Essential (primary) hypertension: Secondary | ICD-10-CM | POA: Diagnosis not present

## 2020-07-20 DIAGNOSIS — E113311 Type 2 diabetes mellitus with moderate nonproliferative diabetic retinopathy with macular edema, right eye: Secondary | ICD-10-CM | POA: Diagnosis not present

## 2020-07-20 DIAGNOSIS — E113392 Type 2 diabetes mellitus with moderate nonproliferative diabetic retinopathy without macular edema, left eye: Secondary | ICD-10-CM | POA: Diagnosis not present

## 2020-07-20 DIAGNOSIS — H35033 Hypertensive retinopathy, bilateral: Secondary | ICD-10-CM

## 2020-07-20 DIAGNOSIS — H43813 Vitreous degeneration, bilateral: Secondary | ICD-10-CM

## 2020-07-24 DIAGNOSIS — M79676 Pain in unspecified toe(s): Secondary | ICD-10-CM

## 2020-08-07 NOTE — Telephone Encounter (Signed)
Thankls.

## 2020-08-17 ENCOUNTER — Other Ambulatory Visit: Payer: Self-pay

## 2020-08-17 ENCOUNTER — Encounter (INDEPENDENT_AMBULATORY_CARE_PROVIDER_SITE_OTHER): Payer: Medicare Other | Admitting: Ophthalmology

## 2020-08-17 DIAGNOSIS — E113311 Type 2 diabetes mellitus with moderate nonproliferative diabetic retinopathy with macular edema, right eye: Secondary | ICD-10-CM

## 2020-08-17 DIAGNOSIS — I10 Essential (primary) hypertension: Secondary | ICD-10-CM | POA: Diagnosis not present

## 2020-08-17 DIAGNOSIS — H35033 Hypertensive retinopathy, bilateral: Secondary | ICD-10-CM

## 2020-08-17 DIAGNOSIS — E113392 Type 2 diabetes mellitus with moderate nonproliferative diabetic retinopathy without macular edema, left eye: Secondary | ICD-10-CM | POA: Diagnosis not present

## 2020-08-17 DIAGNOSIS — H43813 Vitreous degeneration, bilateral: Secondary | ICD-10-CM

## 2020-08-24 DIAGNOSIS — H905 Unspecified sensorineural hearing loss: Secondary | ICD-10-CM | POA: Insufficient documentation

## 2020-08-24 DIAGNOSIS — H9190 Unspecified hearing loss, unspecified ear: Secondary | ICD-10-CM | POA: Insufficient documentation

## 2020-09-12 ENCOUNTER — Encounter: Payer: Self-pay | Admitting: Podiatry

## 2020-09-12 ENCOUNTER — Other Ambulatory Visit: Payer: Self-pay

## 2020-09-12 ENCOUNTER — Ambulatory Visit (INDEPENDENT_AMBULATORY_CARE_PROVIDER_SITE_OTHER): Payer: Medicare Other | Admitting: Podiatry

## 2020-09-12 DIAGNOSIS — M79674 Pain in right toe(s): Secondary | ICD-10-CM | POA: Diagnosis not present

## 2020-09-12 DIAGNOSIS — L84 Corns and callosities: Secondary | ICD-10-CM

## 2020-09-12 DIAGNOSIS — B351 Tinea unguium: Secondary | ICD-10-CM | POA: Diagnosis not present

## 2020-09-12 DIAGNOSIS — Z794 Long term (current) use of insulin: Secondary | ICD-10-CM

## 2020-09-12 DIAGNOSIS — M79675 Pain in left toe(s): Secondary | ICD-10-CM | POA: Diagnosis not present

## 2020-09-12 DIAGNOSIS — E1121 Type 2 diabetes mellitus with diabetic nephropathy: Secondary | ICD-10-CM

## 2020-09-14 ENCOUNTER — Encounter (INDEPENDENT_AMBULATORY_CARE_PROVIDER_SITE_OTHER): Payer: Medicare Other | Admitting: Ophthalmology

## 2020-09-17 NOTE — Progress Notes (Signed)
  Subjective:  Patient ID: Kaitlyn Le, female    DOB: August 04, 1970,  MRN: [id number]  50 y.o. female presents with at risk foot care. Pt has h/o NIDDM with chronic kidney disease and thick, elongated toenails b/l lower extremities which are tender when wearing enclosed shoe gear..    Patient did not check blood glucose this morning.  She is inquiring about her diabetic shoes on today's visit.  PCP: Glenis Smoker, MD and last visit was: February, 2022.  Review of Systems: Negative except as noted in the HPI.   Allergies  Allergen Reactions   Penicillins Hives and Other (See Comments)    Did it involve swelling of the face/tongue/throat, SOB, or low BP? Unknown Did it involve sudden or severe rash/hives, skin peeling, or any reaction on the inside of your mouth or nose? Unknown Did you need to seek medical attention at a hospital or doctor's office? Unknown When did it last happen? childhood reaction.  If all above answers are "NO", may proceed with cephalosporin use.     Objective:  There were no vitals filed for this visit. Constitutional Patient is a pleasant 50 y.o. African American female morbidly obese in NAD. AAO x 3.  Vascular Capillary refill time to digits immediate b/l. Palpable DP pulse(s) b/l lower extremities Palpable PT pulse(s) b/l lower extremities Pedal hair present. Lower extremity skin temperature gradient within normal limits. No pain with calf compression b/l. No cyanosis or clubbing noted.  Neurologic Normal speech. Pt has subjective symptoms of neuropathy. Protective sensation intact 5/5 intact bilaterally with 10g monofilament b/l. Vibratory sensation intact b/l.  Dermatologic Pedal skin with normal turgor, texture and tone bilaterally. No open wounds bilaterally. No interdigital macerations bilaterally. Toenails 1-5 b/l elongated, discolored, dystrophic, thickened, crumbly with subungual debris and tenderness to dorsal palpation. Hyperkeratotic lesion(s)  submet head 1 left foot.  No erythema, no edema, no drainage, no fluctuance.  Orthopedic: Normal muscle strength 5/5 to all lower extremity muscle groups bilaterally. No pain crepitus or joint limitation noted with ROM b/l. Hammertoe(s) noted to the b/l feet.   Assessment:   1. Pain due to onychomycosis of toenails of both feet   2. Callus   3. Type 2 diabetes mellitus with diabetic nephropathy, with long-term current use of insulin ([place])    Plan:  Patient was evaluated and treated and all questions answered.  Onychomycosis with pain -Nails palliatively debridement as below. -Educated on self-care  Procedure: Nail Debridement Rationale: Pain Type of Debridement: manual, sharp debridement. Instrumentation: Nail nipper, rotary burr. Number of Nails: 10  -Examined patient. -Her diabetic shoes have not been ordered. We are still waiting on her signed diabetic shoe certification form from her PCP.. -Continue diabetic foot care principles. -Patient to continue soft, supportive shoe gear daily. -Toenails 1-5 b/l were debrided in length and girth with sterile nail nippers and dremel without iatrogenic bleeding.  -Callus(es) submet head 1 left foot pared utilizing sterile scalpel blade without complication or incident. Total number debrided =1. -Patient to report any pedal injuries to medical professional immediately. -Patient/POA to call should there be question/concern in the interim.   Return in about 3 months (around 12/13/2020).  Marzetta Board, DPM

## 2020-09-19 ENCOUNTER — Encounter (INDEPENDENT_AMBULATORY_CARE_PROVIDER_SITE_OTHER): Payer: Medicare Other | Admitting: Ophthalmology

## 2020-09-21 ENCOUNTER — Telehealth: Payer: Self-pay | Admitting: Podiatry

## 2020-09-21 NOTE — Telephone Encounter (Signed)
Received documents for diabetic shoes and last office visit date with diabetes was discussed was written in as 7.29.21 and it has to be within 6 months.  I called pt to let her know and she said she will call and get an appt. I asked her to let me know when the appt is and I will refax the documents to be signed again with the new office visit.  She said thank you.

## 2020-10-26 ENCOUNTER — Encounter (INDEPENDENT_AMBULATORY_CARE_PROVIDER_SITE_OTHER): Payer: Medicare Other | Admitting: Ophthalmology

## 2020-10-27 ENCOUNTER — Encounter (INDEPENDENT_AMBULATORY_CARE_PROVIDER_SITE_OTHER): Payer: Medicare Other | Admitting: Ophthalmology

## 2020-10-27 ENCOUNTER — Other Ambulatory Visit: Payer: Self-pay

## 2020-10-27 DIAGNOSIS — E113392 Type 2 diabetes mellitus with moderate nonproliferative diabetic retinopathy without macular edema, left eye: Secondary | ICD-10-CM

## 2020-10-27 DIAGNOSIS — H35033 Hypertensive retinopathy, bilateral: Secondary | ICD-10-CM

## 2020-10-27 DIAGNOSIS — E113311 Type 2 diabetes mellitus with moderate nonproliferative diabetic retinopathy with macular edema, right eye: Secondary | ICD-10-CM | POA: Diagnosis not present

## 2020-10-27 DIAGNOSIS — H43813 Vitreous degeneration, bilateral: Secondary | ICD-10-CM

## 2020-10-27 DIAGNOSIS — I10 Essential (primary) hypertension: Secondary | ICD-10-CM | POA: Diagnosis not present

## 2020-11-23 ENCOUNTER — Other Ambulatory Visit: Payer: Self-pay

## 2020-11-23 ENCOUNTER — Encounter (INDEPENDENT_AMBULATORY_CARE_PROVIDER_SITE_OTHER): Payer: Medicare Other | Admitting: Ophthalmology

## 2020-11-23 DIAGNOSIS — E113311 Type 2 diabetes mellitus with moderate nonproliferative diabetic retinopathy with macular edema, right eye: Secondary | ICD-10-CM

## 2020-11-23 DIAGNOSIS — I10 Essential (primary) hypertension: Secondary | ICD-10-CM | POA: Diagnosis not present

## 2020-11-23 DIAGNOSIS — E113392 Type 2 diabetes mellitus with moderate nonproliferative diabetic retinopathy without macular edema, left eye: Secondary | ICD-10-CM

## 2020-11-23 DIAGNOSIS — H35033 Hypertensive retinopathy, bilateral: Secondary | ICD-10-CM

## 2020-11-23 DIAGNOSIS — H43813 Vitreous degeneration, bilateral: Secondary | ICD-10-CM

## 2020-12-19 ENCOUNTER — Ambulatory Visit (INDEPENDENT_AMBULATORY_CARE_PROVIDER_SITE_OTHER): Payer: Medicare Other | Admitting: Podiatry

## 2020-12-19 DIAGNOSIS — L84 Corns and callosities: Secondary | ICD-10-CM

## 2020-12-19 DIAGNOSIS — M79674 Pain in right toe(s): Secondary | ICD-10-CM

## 2020-12-19 DIAGNOSIS — M79675 Pain in left toe(s): Secondary | ICD-10-CM

## 2020-12-19 DIAGNOSIS — B351 Tinea unguium: Secondary | ICD-10-CM

## 2020-12-19 DIAGNOSIS — Z794 Long term (current) use of insulin: Secondary | ICD-10-CM

## 2020-12-19 DIAGNOSIS — E1121 Type 2 diabetes mellitus with diabetic nephropathy: Secondary | ICD-10-CM

## 2020-12-19 NOTE — Progress Notes (Signed)
No show for appointment. Patient recently underwent kidney transplant. No charge.

## 2020-12-21 ENCOUNTER — Encounter (INDEPENDENT_AMBULATORY_CARE_PROVIDER_SITE_OTHER): Payer: Medicare Other | Admitting: Ophthalmology

## 2021-01-09 ENCOUNTER — Encounter (INDEPENDENT_AMBULATORY_CARE_PROVIDER_SITE_OTHER): Payer: Medicare Other | Admitting: Ophthalmology

## 2021-01-19 ENCOUNTER — Encounter (INDEPENDENT_AMBULATORY_CARE_PROVIDER_SITE_OTHER): Payer: Medicare Other | Admitting: Ophthalmology

## 2021-01-19 ENCOUNTER — Other Ambulatory Visit: Payer: Self-pay

## 2021-01-19 DIAGNOSIS — E113392 Type 2 diabetes mellitus with moderate nonproliferative diabetic retinopathy without macular edema, left eye: Secondary | ICD-10-CM | POA: Diagnosis not present

## 2021-01-19 DIAGNOSIS — I10 Essential (primary) hypertension: Secondary | ICD-10-CM

## 2021-01-19 DIAGNOSIS — H43813 Vitreous degeneration, bilateral: Secondary | ICD-10-CM

## 2021-01-19 DIAGNOSIS — H35033 Hypertensive retinopathy, bilateral: Secondary | ICD-10-CM | POA: Diagnosis not present

## 2021-01-19 DIAGNOSIS — E113311 Type 2 diabetes mellitus with moderate nonproliferative diabetic retinopathy with macular edema, right eye: Secondary | ICD-10-CM

## 2021-02-15 ENCOUNTER — Encounter (INDEPENDENT_AMBULATORY_CARE_PROVIDER_SITE_OTHER): Payer: Medicare Other | Admitting: Ophthalmology

## 2021-02-22 ENCOUNTER — Encounter (INDEPENDENT_AMBULATORY_CARE_PROVIDER_SITE_OTHER): Payer: Medicare Other | Admitting: Ophthalmology

## 2021-02-22 ENCOUNTER — Other Ambulatory Visit: Payer: Self-pay

## 2021-02-22 DIAGNOSIS — I10 Essential (primary) hypertension: Secondary | ICD-10-CM

## 2021-02-22 DIAGNOSIS — H35033 Hypertensive retinopathy, bilateral: Secondary | ICD-10-CM | POA: Diagnosis not present

## 2021-02-22 DIAGNOSIS — H43813 Vitreous degeneration, bilateral: Secondary | ICD-10-CM

## 2021-02-22 DIAGNOSIS — E113313 Type 2 diabetes mellitus with moderate nonproliferative diabetic retinopathy with macular edema, bilateral: Secondary | ICD-10-CM | POA: Diagnosis not present

## 2021-03-22 ENCOUNTER — Encounter (INDEPENDENT_AMBULATORY_CARE_PROVIDER_SITE_OTHER): Payer: Medicare Other | Admitting: Ophthalmology

## 2021-04-12 DIAGNOSIS — B349 Viral infection, unspecified: Secondary | ICD-10-CM | POA: Diagnosis not present

## 2021-04-12 DIAGNOSIS — E119 Type 2 diabetes mellitus without complications: Secondary | ICD-10-CM | POA: Diagnosis not present

## 2021-04-12 DIAGNOSIS — Z79621 Long term (current) use of calcineurin inhibitor: Secondary | ICD-10-CM | POA: Diagnosis not present

## 2021-04-12 DIAGNOSIS — D649 Anemia, unspecified: Secondary | ICD-10-CM | POA: Diagnosis not present

## 2021-04-12 DIAGNOSIS — R112 Nausea with vomiting, unspecified: Secondary | ICD-10-CM | POA: Diagnosis not present

## 2021-04-12 DIAGNOSIS — K5900 Constipation, unspecified: Secondary | ICD-10-CM | POA: Diagnosis not present

## 2021-04-12 DIAGNOSIS — K219 Gastro-esophageal reflux disease without esophagitis: Secondary | ICD-10-CM | POA: Diagnosis not present

## 2021-04-12 DIAGNOSIS — Z7952 Long term (current) use of systemic steroids: Secondary | ICD-10-CM | POA: Diagnosis not present

## 2021-04-12 DIAGNOSIS — Z4822 Encounter for aftercare following kidney transplant: Secondary | ICD-10-CM | POA: Diagnosis not present

## 2021-04-12 DIAGNOSIS — Z792 Long term (current) use of antibiotics: Secondary | ICD-10-CM | POA: Diagnosis not present

## 2021-04-12 DIAGNOSIS — Z5181 Encounter for therapeutic drug level monitoring: Secondary | ICD-10-CM | POA: Diagnosis not present

## 2021-04-12 DIAGNOSIS — R0789 Other chest pain: Secondary | ICD-10-CM | POA: Diagnosis not present

## 2021-04-12 DIAGNOSIS — T50905A Adverse effect of unspecified drugs, medicaments and biological substances, initial encounter: Secondary | ICD-10-CM | POA: Diagnosis not present

## 2021-04-12 DIAGNOSIS — I10 Essential (primary) hypertension: Secondary | ICD-10-CM | POA: Diagnosis not present

## 2021-04-12 DIAGNOSIS — Z79899 Other long term (current) drug therapy: Secondary | ICD-10-CM | POA: Diagnosis not present

## 2021-04-26 DIAGNOSIS — Z792 Long term (current) use of antibiotics: Secondary | ICD-10-CM | POA: Diagnosis not present

## 2021-04-26 DIAGNOSIS — K219 Gastro-esophageal reflux disease without esophagitis: Secondary | ICD-10-CM | POA: Diagnosis not present

## 2021-04-26 DIAGNOSIS — D849 Immunodeficiency, unspecified: Secondary | ICD-10-CM | POA: Diagnosis not present

## 2021-04-26 DIAGNOSIS — E119 Type 2 diabetes mellitus without complications: Secondary | ICD-10-CM | POA: Diagnosis not present

## 2021-04-26 DIAGNOSIS — E872 Acidosis, unspecified: Secondary | ICD-10-CM | POA: Diagnosis not present

## 2021-04-26 DIAGNOSIS — Z23 Encounter for immunization: Secondary | ICD-10-CM | POA: Diagnosis not present

## 2021-04-26 DIAGNOSIS — I10 Essential (primary) hypertension: Secondary | ICD-10-CM | POA: Diagnosis not present

## 2021-04-26 DIAGNOSIS — B349 Viral infection, unspecified: Secondary | ICD-10-CM | POA: Diagnosis not present

## 2021-04-26 DIAGNOSIS — Z4822 Encounter for aftercare following kidney transplant: Secondary | ICD-10-CM | POA: Diagnosis not present

## 2021-04-26 DIAGNOSIS — Z79899 Other long term (current) drug therapy: Secondary | ICD-10-CM | POA: Diagnosis not present

## 2021-04-26 DIAGNOSIS — E1121 Type 2 diabetes mellitus with diabetic nephropathy: Secondary | ICD-10-CM | POA: Diagnosis not present

## 2021-04-26 DIAGNOSIS — Z7952 Long term (current) use of systemic steroids: Secondary | ICD-10-CM | POA: Diagnosis not present

## 2021-04-26 DIAGNOSIS — K5900 Constipation, unspecified: Secondary | ICD-10-CM | POA: Diagnosis not present

## 2021-04-26 DIAGNOSIS — E785 Hyperlipidemia, unspecified: Secondary | ICD-10-CM | POA: Diagnosis not present

## 2021-04-26 DIAGNOSIS — R112 Nausea with vomiting, unspecified: Secondary | ICD-10-CM | POA: Diagnosis not present

## 2021-04-26 DIAGNOSIS — D649 Anemia, unspecified: Secondary | ICD-10-CM | POA: Diagnosis not present

## 2021-04-26 DIAGNOSIS — R0789 Other chest pain: Secondary | ICD-10-CM | POA: Diagnosis not present

## 2021-05-03 ENCOUNTER — Other Ambulatory Visit: Payer: Self-pay

## 2021-05-03 ENCOUNTER — Encounter (INDEPENDENT_AMBULATORY_CARE_PROVIDER_SITE_OTHER): Payer: Medicare Other | Admitting: Ophthalmology

## 2021-05-03 DIAGNOSIS — H43813 Vitreous degeneration, bilateral: Secondary | ICD-10-CM

## 2021-05-03 DIAGNOSIS — E113313 Type 2 diabetes mellitus with moderate nonproliferative diabetic retinopathy with macular edema, bilateral: Secondary | ICD-10-CM

## 2021-05-03 DIAGNOSIS — H35033 Hypertensive retinopathy, bilateral: Secondary | ICD-10-CM | POA: Diagnosis not present

## 2021-05-03 DIAGNOSIS — I10 Essential (primary) hypertension: Secondary | ICD-10-CM

## 2021-05-31 ENCOUNTER — Encounter (INDEPENDENT_AMBULATORY_CARE_PROVIDER_SITE_OTHER): Payer: Medicare Other | Admitting: Ophthalmology

## 2021-05-31 ENCOUNTER — Other Ambulatory Visit: Payer: Self-pay

## 2021-05-31 DIAGNOSIS — H35033 Hypertensive retinopathy, bilateral: Secondary | ICD-10-CM | POA: Diagnosis not present

## 2021-05-31 DIAGNOSIS — E113392 Type 2 diabetes mellitus with moderate nonproliferative diabetic retinopathy without macular edema, left eye: Secondary | ICD-10-CM | POA: Diagnosis not present

## 2021-05-31 DIAGNOSIS — E113311 Type 2 diabetes mellitus with moderate nonproliferative diabetic retinopathy with macular edema, right eye: Secondary | ICD-10-CM

## 2021-05-31 DIAGNOSIS — I10 Essential (primary) hypertension: Secondary | ICD-10-CM

## 2021-05-31 DIAGNOSIS — H43813 Vitreous degeneration, bilateral: Secondary | ICD-10-CM | POA: Diagnosis not present

## 2021-06-01 DIAGNOSIS — Z79621 Long term (current) use of calcineurin inhibitor: Secondary | ICD-10-CM | POA: Diagnosis not present

## 2021-06-01 DIAGNOSIS — E785 Hyperlipidemia, unspecified: Secondary | ICD-10-CM | POA: Diagnosis not present

## 2021-06-01 DIAGNOSIS — Z792 Long term (current) use of antibiotics: Secondary | ICD-10-CM | POA: Diagnosis not present

## 2021-06-01 DIAGNOSIS — E872 Acidosis, unspecified: Secondary | ICD-10-CM | POA: Diagnosis not present

## 2021-06-01 DIAGNOSIS — D72819 Decreased white blood cell count, unspecified: Secondary | ICD-10-CM | POA: Diagnosis not present

## 2021-06-01 DIAGNOSIS — D649 Anemia, unspecified: Secondary | ICD-10-CM | POA: Diagnosis not present

## 2021-06-01 DIAGNOSIS — I10 Essential (primary) hypertension: Secondary | ICD-10-CM | POA: Diagnosis not present

## 2021-06-01 DIAGNOSIS — Z79899 Other long term (current) drug therapy: Secondary | ICD-10-CM | POA: Diagnosis not present

## 2021-06-01 DIAGNOSIS — E1165 Type 2 diabetes mellitus with hyperglycemia: Secondary | ICD-10-CM | POA: Diagnosis not present

## 2021-06-01 DIAGNOSIS — Z4822 Encounter for aftercare following kidney transplant: Secondary | ICD-10-CM | POA: Diagnosis not present

## 2021-06-28 ENCOUNTER — Encounter (INDEPENDENT_AMBULATORY_CARE_PROVIDER_SITE_OTHER): Payer: Medicare Other | Admitting: Ophthalmology

## 2021-06-28 DIAGNOSIS — Z4822 Encounter for aftercare following kidney transplant: Secondary | ICD-10-CM | POA: Diagnosis not present

## 2021-06-28 DIAGNOSIS — K219 Gastro-esophageal reflux disease without esophagitis: Secondary | ICD-10-CM | POA: Diagnosis not present

## 2021-06-28 DIAGNOSIS — Z792 Long term (current) use of antibiotics: Secondary | ICD-10-CM | POA: Diagnosis not present

## 2021-06-28 DIAGNOSIS — Z79621 Long term (current) use of calcineurin inhibitor: Secondary | ICD-10-CM | POA: Diagnosis not present

## 2021-06-28 DIAGNOSIS — D649 Anemia, unspecified: Secondary | ICD-10-CM | POA: Diagnosis not present

## 2021-06-28 DIAGNOSIS — E785 Hyperlipidemia, unspecified: Secondary | ICD-10-CM | POA: Diagnosis not present

## 2021-06-28 DIAGNOSIS — D72819 Decreased white blood cell count, unspecified: Secondary | ICD-10-CM | POA: Diagnosis not present

## 2021-06-28 DIAGNOSIS — E119 Type 2 diabetes mellitus without complications: Secondary | ICD-10-CM | POA: Diagnosis not present

## 2021-06-28 DIAGNOSIS — R0789 Other chest pain: Secondary | ICD-10-CM | POA: Diagnosis not present

## 2021-06-28 DIAGNOSIS — Z7952 Long term (current) use of systemic steroids: Secondary | ICD-10-CM | POA: Diagnosis not present

## 2021-06-28 DIAGNOSIS — Z940 Kidney transplant status: Secondary | ICD-10-CM | POA: Diagnosis not present

## 2021-06-28 DIAGNOSIS — Z7969 Long term (current) use of other immunomodulators and immunosuppressants: Secondary | ICD-10-CM | POA: Diagnosis not present

## 2021-06-28 DIAGNOSIS — D849 Immunodeficiency, unspecified: Secondary | ICD-10-CM | POA: Diagnosis not present

## 2021-06-28 DIAGNOSIS — Z79899 Other long term (current) drug therapy: Secondary | ICD-10-CM | POA: Diagnosis not present

## 2021-06-28 DIAGNOSIS — I10 Essential (primary) hypertension: Secondary | ICD-10-CM | POA: Diagnosis not present

## 2021-06-28 DIAGNOSIS — Z794 Long term (current) use of insulin: Secondary | ICD-10-CM | POA: Diagnosis not present

## 2021-06-28 DIAGNOSIS — Z7984 Long term (current) use of oral hypoglycemic drugs: Secondary | ICD-10-CM | POA: Diagnosis not present

## 2021-06-29 ENCOUNTER — Other Ambulatory Visit: Payer: Self-pay

## 2021-06-29 ENCOUNTER — Encounter (INDEPENDENT_AMBULATORY_CARE_PROVIDER_SITE_OTHER): Payer: Medicare Other | Admitting: Ophthalmology

## 2021-06-29 DIAGNOSIS — E113392 Type 2 diabetes mellitus with moderate nonproliferative diabetic retinopathy without macular edema, left eye: Secondary | ICD-10-CM | POA: Diagnosis not present

## 2021-06-29 DIAGNOSIS — E113311 Type 2 diabetes mellitus with moderate nonproliferative diabetic retinopathy with macular edema, right eye: Secondary | ICD-10-CM | POA: Diagnosis not present

## 2021-06-29 DIAGNOSIS — I10 Essential (primary) hypertension: Secondary | ICD-10-CM

## 2021-06-29 DIAGNOSIS — H43813 Vitreous degeneration, bilateral: Secondary | ICD-10-CM

## 2021-06-29 DIAGNOSIS — H35033 Hypertensive retinopathy, bilateral: Secondary | ICD-10-CM | POA: Diagnosis not present

## 2021-07-26 ENCOUNTER — Encounter (INDEPENDENT_AMBULATORY_CARE_PROVIDER_SITE_OTHER): Payer: Medicare Other | Admitting: Ophthalmology

## 2021-07-26 DIAGNOSIS — Z79621 Long term (current) use of calcineurin inhibitor: Secondary | ICD-10-CM | POA: Diagnosis not present

## 2021-07-26 DIAGNOSIS — E113311 Type 2 diabetes mellitus with moderate nonproliferative diabetic retinopathy with macular edema, right eye: Secondary | ICD-10-CM | POA: Diagnosis not present

## 2021-07-26 DIAGNOSIS — Z7952 Long term (current) use of systemic steroids: Secondary | ICD-10-CM | POA: Diagnosis not present

## 2021-07-26 DIAGNOSIS — D72819 Decreased white blood cell count, unspecified: Secondary | ICD-10-CM | POA: Diagnosis not present

## 2021-07-26 DIAGNOSIS — Z940 Kidney transplant status: Secondary | ICD-10-CM | POA: Diagnosis not present

## 2021-07-26 DIAGNOSIS — H35033 Hypertensive retinopathy, bilateral: Secondary | ICD-10-CM

## 2021-07-26 DIAGNOSIS — Z794 Long term (current) use of insulin: Secondary | ICD-10-CM | POA: Diagnosis not present

## 2021-07-26 DIAGNOSIS — Z7985 Long-term (current) use of injectable non-insulin antidiabetic drugs: Secondary | ICD-10-CM | POA: Diagnosis not present

## 2021-07-26 DIAGNOSIS — H43813 Vitreous degeneration, bilateral: Secondary | ICD-10-CM

## 2021-07-26 DIAGNOSIS — I10 Essential (primary) hypertension: Secondary | ICD-10-CM

## 2021-07-26 DIAGNOSIS — E113392 Type 2 diabetes mellitus with moderate nonproliferative diabetic retinopathy without macular edema, left eye: Secondary | ICD-10-CM

## 2021-07-26 DIAGNOSIS — D849 Immunodeficiency, unspecified: Secondary | ICD-10-CM | POA: Diagnosis not present

## 2021-07-26 DIAGNOSIS — E119 Type 2 diabetes mellitus without complications: Secondary | ICD-10-CM | POA: Diagnosis not present

## 2021-07-26 DIAGNOSIS — D649 Anemia, unspecified: Secondary | ICD-10-CM | POA: Diagnosis not present

## 2021-07-26 DIAGNOSIS — K219 Gastro-esophageal reflux disease without esophagitis: Secondary | ICD-10-CM | POA: Diagnosis not present

## 2021-07-26 DIAGNOSIS — Z79899 Other long term (current) drug therapy: Secondary | ICD-10-CM | POA: Diagnosis not present

## 2021-07-26 DIAGNOSIS — Z792 Long term (current) use of antibiotics: Secondary | ICD-10-CM | POA: Diagnosis not present

## 2021-07-26 DIAGNOSIS — E785 Hyperlipidemia, unspecified: Secondary | ICD-10-CM | POA: Diagnosis not present

## 2021-07-26 DIAGNOSIS — Z4822 Encounter for aftercare following kidney transplant: Secondary | ICD-10-CM | POA: Diagnosis not present

## 2021-08-23 ENCOUNTER — Encounter (INDEPENDENT_AMBULATORY_CARE_PROVIDER_SITE_OTHER): Payer: Medicare Other | Admitting: Ophthalmology

## 2021-08-23 DIAGNOSIS — Z940 Kidney transplant status: Secondary | ICD-10-CM | POA: Diagnosis not present

## 2021-08-23 DIAGNOSIS — E113392 Type 2 diabetes mellitus with moderate nonproliferative diabetic retinopathy without macular edema, left eye: Secondary | ICD-10-CM

## 2021-08-23 DIAGNOSIS — D849 Immunodeficiency, unspecified: Secondary | ICD-10-CM | POA: Diagnosis not present

## 2021-08-23 DIAGNOSIS — D72819 Decreased white blood cell count, unspecified: Secondary | ICD-10-CM | POA: Diagnosis not present

## 2021-08-23 DIAGNOSIS — I10 Essential (primary) hypertension: Secondary | ICD-10-CM | POA: Diagnosis not present

## 2021-08-23 DIAGNOSIS — Z792 Long term (current) use of antibiotics: Secondary | ICD-10-CM | POA: Diagnosis not present

## 2021-08-23 DIAGNOSIS — H43813 Vitreous degeneration, bilateral: Secondary | ICD-10-CM | POA: Diagnosis not present

## 2021-08-23 DIAGNOSIS — Z794 Long term (current) use of insulin: Secondary | ICD-10-CM | POA: Diagnosis not present

## 2021-08-23 DIAGNOSIS — K219 Gastro-esophageal reflux disease without esophagitis: Secondary | ICD-10-CM | POA: Diagnosis not present

## 2021-08-23 DIAGNOSIS — Z7985 Long-term (current) use of injectable non-insulin antidiabetic drugs: Secondary | ICD-10-CM | POA: Diagnosis not present

## 2021-08-23 DIAGNOSIS — Z7952 Long term (current) use of systemic steroids: Secondary | ICD-10-CM | POA: Diagnosis not present

## 2021-08-23 DIAGNOSIS — E113311 Type 2 diabetes mellitus with moderate nonproliferative diabetic retinopathy with macular edema, right eye: Secondary | ICD-10-CM

## 2021-08-23 DIAGNOSIS — H35033 Hypertensive retinopathy, bilateral: Secondary | ICD-10-CM

## 2021-08-23 DIAGNOSIS — Z79899 Other long term (current) drug therapy: Secondary | ICD-10-CM | POA: Diagnosis not present

## 2021-08-23 DIAGNOSIS — E785 Hyperlipidemia, unspecified: Secondary | ICD-10-CM | POA: Diagnosis not present

## 2021-08-23 DIAGNOSIS — Z79624 Long term (current) use of inhibitors of nucleotide synthesis: Secondary | ICD-10-CM | POA: Diagnosis not present

## 2021-08-23 DIAGNOSIS — Z79621 Long term (current) use of calcineurin inhibitor: Secondary | ICD-10-CM | POA: Diagnosis not present

## 2021-08-23 DIAGNOSIS — Z5181 Encounter for therapeutic drug level monitoring: Secondary | ICD-10-CM | POA: Diagnosis not present

## 2021-08-23 DIAGNOSIS — E1121 Type 2 diabetes mellitus with diabetic nephropathy: Secondary | ICD-10-CM | POA: Diagnosis not present

## 2021-08-23 DIAGNOSIS — D649 Anemia, unspecified: Secondary | ICD-10-CM | POA: Diagnosis not present

## 2021-08-23 DIAGNOSIS — Z4822 Encounter for aftercare following kidney transplant: Secondary | ICD-10-CM | POA: Diagnosis not present

## 2021-09-07 DIAGNOSIS — Z4822 Encounter for aftercare following kidney transplant: Secondary | ICD-10-CM | POA: Diagnosis not present

## 2021-09-20 ENCOUNTER — Encounter (INDEPENDENT_AMBULATORY_CARE_PROVIDER_SITE_OTHER): Payer: Medicare Other | Admitting: Ophthalmology

## 2021-09-20 DIAGNOSIS — Z7985 Long-term (current) use of injectable non-insulin antidiabetic drugs: Secondary | ICD-10-CM | POA: Diagnosis not present

## 2021-09-20 DIAGNOSIS — E119 Type 2 diabetes mellitus without complications: Secondary | ICD-10-CM | POA: Diagnosis not present

## 2021-09-20 DIAGNOSIS — E785 Hyperlipidemia, unspecified: Secondary | ICD-10-CM | POA: Diagnosis not present

## 2021-09-20 DIAGNOSIS — Z5181 Encounter for therapeutic drug level monitoring: Secondary | ICD-10-CM | POA: Diagnosis not present

## 2021-09-20 DIAGNOSIS — K219 Gastro-esophageal reflux disease without esophagitis: Secondary | ICD-10-CM | POA: Diagnosis not present

## 2021-09-20 DIAGNOSIS — Z794 Long term (current) use of insulin: Secondary | ICD-10-CM | POA: Diagnosis not present

## 2021-09-20 DIAGNOSIS — Z79621 Long term (current) use of calcineurin inhibitor: Secondary | ICD-10-CM | POA: Diagnosis not present

## 2021-09-20 DIAGNOSIS — Z79624 Long term (current) use of inhibitors of nucleotide synthesis: Secondary | ICD-10-CM | POA: Diagnosis not present

## 2021-09-20 DIAGNOSIS — D72819 Decreased white blood cell count, unspecified: Secondary | ICD-10-CM | POA: Diagnosis not present

## 2021-09-20 DIAGNOSIS — R0789 Other chest pain: Secondary | ICD-10-CM | POA: Diagnosis not present

## 2021-09-20 DIAGNOSIS — Z792 Long term (current) use of antibiotics: Secondary | ICD-10-CM | POA: Diagnosis not present

## 2021-09-20 DIAGNOSIS — Z7952 Long term (current) use of systemic steroids: Secondary | ICD-10-CM | POA: Diagnosis not present

## 2021-09-20 DIAGNOSIS — D849 Immunodeficiency, unspecified: Secondary | ICD-10-CM | POA: Diagnosis not present

## 2021-09-20 DIAGNOSIS — Z4822 Encounter for aftercare following kidney transplant: Secondary | ICD-10-CM | POA: Diagnosis not present

## 2021-09-20 DIAGNOSIS — I10 Essential (primary) hypertension: Secondary | ICD-10-CM | POA: Diagnosis not present

## 2021-09-20 DIAGNOSIS — D649 Anemia, unspecified: Secondary | ICD-10-CM | POA: Diagnosis not present

## 2021-09-24 ENCOUNTER — Encounter (INDEPENDENT_AMBULATORY_CARE_PROVIDER_SITE_OTHER): Payer: Medicare Other | Admitting: Ophthalmology

## 2021-10-02 DIAGNOSIS — E1165 Type 2 diabetes mellitus with hyperglycemia: Secondary | ICD-10-CM | POA: Diagnosis not present

## 2021-10-02 DIAGNOSIS — Z7985 Long-term (current) use of injectable non-insulin antidiabetic drugs: Secondary | ICD-10-CM | POA: Diagnosis not present

## 2021-10-02 DIAGNOSIS — E1121 Type 2 diabetes mellitus with diabetic nephropathy: Secondary | ICD-10-CM | POA: Diagnosis not present

## 2021-10-02 DIAGNOSIS — Z794 Long term (current) use of insulin: Secondary | ICD-10-CM | POA: Diagnosis not present

## 2021-10-08 DIAGNOSIS — D849 Immunodeficiency, unspecified: Secondary | ICD-10-CM | POA: Diagnosis not present

## 2021-10-08 DIAGNOSIS — Z940 Kidney transplant status: Secondary | ICD-10-CM | POA: Diagnosis not present

## 2021-10-18 DIAGNOSIS — Z7952 Long term (current) use of systemic steroids: Secondary | ICD-10-CM | POA: Diagnosis not present

## 2021-10-18 DIAGNOSIS — Z4822 Encounter for aftercare following kidney transplant: Secondary | ICD-10-CM | POA: Diagnosis not present

## 2021-10-18 DIAGNOSIS — D849 Immunodeficiency, unspecified: Secondary | ICD-10-CM | POA: Diagnosis not present

## 2021-10-18 DIAGNOSIS — E1121 Type 2 diabetes mellitus with diabetic nephropathy: Secondary | ICD-10-CM | POA: Diagnosis not present

## 2021-10-18 DIAGNOSIS — Z79899 Other long term (current) drug therapy: Secondary | ICD-10-CM | POA: Diagnosis not present

## 2021-10-18 DIAGNOSIS — Z79621 Long term (current) use of calcineurin inhibitor: Secondary | ICD-10-CM | POA: Diagnosis not present

## 2021-10-18 DIAGNOSIS — E11649 Type 2 diabetes mellitus with hypoglycemia without coma: Secondary | ICD-10-CM | POA: Diagnosis not present

## 2021-10-18 DIAGNOSIS — D72819 Decreased white blood cell count, unspecified: Secondary | ICD-10-CM | POA: Diagnosis not present

## 2021-10-18 DIAGNOSIS — D649 Anemia, unspecified: Secondary | ICD-10-CM | POA: Diagnosis not present

## 2021-10-18 DIAGNOSIS — Z8619 Personal history of other infectious and parasitic diseases: Secondary | ICD-10-CM | POA: Diagnosis not present

## 2021-10-18 DIAGNOSIS — E785 Hyperlipidemia, unspecified: Secondary | ICD-10-CM | POA: Diagnosis not present

## 2021-10-18 DIAGNOSIS — Z940 Kidney transplant status: Secondary | ICD-10-CM | POA: Diagnosis not present

## 2021-10-18 DIAGNOSIS — Z79624 Long term (current) use of inhibitors of nucleotide synthesis: Secondary | ICD-10-CM | POA: Diagnosis not present

## 2021-10-18 DIAGNOSIS — Z7985 Long-term (current) use of injectable non-insulin antidiabetic drugs: Secondary | ICD-10-CM | POA: Diagnosis not present

## 2021-10-18 DIAGNOSIS — I10 Essential (primary) hypertension: Secondary | ICD-10-CM | POA: Diagnosis not present

## 2021-10-18 DIAGNOSIS — E1165 Type 2 diabetes mellitus with hyperglycemia: Secondary | ICD-10-CM | POA: Diagnosis not present

## 2021-10-18 DIAGNOSIS — Z792 Long term (current) use of antibiotics: Secondary | ICD-10-CM | POA: Diagnosis not present

## 2021-10-18 DIAGNOSIS — K219 Gastro-esophageal reflux disease without esophagitis: Secondary | ICD-10-CM | POA: Diagnosis not present

## 2021-12-19 ENCOUNTER — Emergency Department (HOSPITAL_BASED_OUTPATIENT_CLINIC_OR_DEPARTMENT_OTHER): Payer: Medicare Other

## 2021-12-19 ENCOUNTER — Other Ambulatory Visit: Payer: Self-pay

## 2021-12-19 ENCOUNTER — Emergency Department (HOSPITAL_BASED_OUTPATIENT_CLINIC_OR_DEPARTMENT_OTHER)
Admission: EM | Admit: 2021-12-19 | Discharge: 2021-12-19 | Disposition: A | Discharge status: Home / Self Care | Payer: Medicare Other | Attending: Emergency Medicine | Admitting: Emergency Medicine

## 2021-12-19 DIAGNOSIS — R059 Cough, unspecified: Secondary | ICD-10-CM | POA: Diagnosis not present

## 2021-12-19 DIAGNOSIS — Z7982 Long term (current) use of aspirin: Secondary | ICD-10-CM | POA: Insufficient documentation

## 2021-12-19 DIAGNOSIS — J069 Acute upper respiratory infection, unspecified: Secondary | ICD-10-CM | POA: Insufficient documentation

## 2021-12-19 DIAGNOSIS — J1089 Influenza due to other identified influenza virus with other manifestations: Secondary | ICD-10-CM | POA: Insufficient documentation

## 2021-12-19 DIAGNOSIS — N186 End stage renal disease: Secondary | ICD-10-CM | POA: Diagnosis not present

## 2021-12-19 DIAGNOSIS — N189 Chronic kidney disease, unspecified: Secondary | ICD-10-CM

## 2021-12-19 DIAGNOSIS — E1122 Type 2 diabetes mellitus with diabetic chronic kidney disease: Secondary | ICD-10-CM | POA: Insufficient documentation

## 2021-12-19 DIAGNOSIS — Z79899 Other long term (current) drug therapy: Secondary | ICD-10-CM | POA: Diagnosis not present

## 2021-12-19 DIAGNOSIS — I120 Hypertensive chronic kidney disease with stage 5 chronic kidney disease or end stage renal disease: Secondary | ICD-10-CM | POA: Insufficient documentation

## 2021-12-19 DIAGNOSIS — R0602 Shortness of breath: Secondary | ICD-10-CM | POA: Diagnosis not present

## 2021-12-19 DIAGNOSIS — Z992 Dependence on renal dialysis: Secondary | ICD-10-CM | POA: Diagnosis not present

## 2021-12-19 DIAGNOSIS — Z940 Kidney transplant status: Secondary | ICD-10-CM | POA: Insufficient documentation

## 2021-12-19 DIAGNOSIS — Z20822 Contact with and (suspected) exposure to covid-19: Secondary | ICD-10-CM | POA: Diagnosis not present

## 2021-12-19 LAB — CBC WITH DIFFERENTIAL/PLATELET
Abs Immature Granulocytes: 0.02 10*3/uL (ref 0.00–0.07)
Basophils Absolute: 0 10*3/uL (ref 0.0–0.1)
Basophils Relative: 1 %
Eosinophils Absolute: 0.1 10*3/uL (ref 0.0–0.5)
Eosinophils Relative: 2 %
HCT: 37.6 % (ref 36.0–46.0)
Hemoglobin: 11.8 g/dL — ABNORMAL LOW (ref 12.0–15.0)
Immature Granulocytes: 1 %
Lymphocytes Relative: 15 %
Lymphs Abs: 0.5 10*3/uL — ABNORMAL LOW (ref 0.7–4.0)
MCH: 28.7 pg (ref 26.0–34.0)
MCHC: 31.4 g/dL (ref 30.0–36.0)
MCV: 91.5 fL (ref 80.0–100.0)
Monocytes Absolute: 0.3 10*3/uL (ref 0.1–1.0)
Monocytes Relative: 11 %
Neutro Abs: 2.3 10*3/uL (ref 1.7–7.7)
Neutrophils Relative %: 70 %
Platelets: 236 10*3/uL (ref 150–400)
RBC: 4.11 MIL/uL (ref 3.87–5.11)
RDW: 13.2 % (ref 11.5–15.5)
WBC: 3.2 10*3/uL — ABNORMAL LOW (ref 4.0–10.5)
nRBC: 0 % (ref 0.0–0.2)

## 2021-12-19 LAB — BASIC METABOLIC PANEL
Anion gap: 9 (ref 5–15)
BUN: 39 mg/dL — ABNORMAL HIGH (ref 6–20)
CO2: 26 mmol/L (ref 22–32)
Calcium: 9.2 mg/dL (ref 8.9–10.3)
Chloride: 104 mmol/L (ref 98–111)
Creatinine, Ser: 1.65 mg/dL — ABNORMAL HIGH (ref 0.44–1.00)
GFR, Estimated: 37 mL/min — ABNORMAL LOW (ref >60)
Glucose, Bld: 148 mg/dL — ABNORMAL HIGH (ref 70–99)
Potassium: 4.2 mmol/L (ref 3.5–5.1)
Sodium: 139 mmol/L (ref 135–145)

## 2021-12-19 LAB — RESP PANEL BY RT-PCR (FLU A&B, COVID) ARPGX2
Influenza A by PCR: NEGATIVE
Influenza B by PCR: NEGATIVE
SARS Coronavirus 2 by RT PCR: NEGATIVE

## 2021-12-19 LAB — PHOSPHORUS: Phosphorus: 4.3 mg/dL (ref 2.5–4.6)

## 2021-12-19 LAB — MAGNESIUM: Magnesium: 2 mg/dL (ref 1.7–2.4)

## 2021-12-19 NOTE — ED Provider Notes (Signed)
[facility] EMERGENCY DEPT Provider Note   CSN: [id number] Arrival date & time: 12/19/21  5638     History  Chief Complaint  Patient presents with   Influenza    Kaitlyn Le is a 51 y.o. female.   Influenza    51 year old female with medical history significant for HTN, HLD, gastroparesis, DM 2, ESRD status post renal transplant on immunosuppression who presents to the emergency department with multiple complaints.  The patient states that 9 days ago she developed a sore throat, cough, shortness of breath.  She developed increasing fatigue.  She is tolerating oral intake and denies any decreased urine output.  She states that 2 days ago she developed diarrhea which has since resolved.  The diarrhea was watery, nonbloody.  She denies any abdominal pain at this time.  She denies any dysuria or increased urinary frequency.  She denies any fevers or chills.  She called her renal transplant navigator and was advised to present to the emergency department for COVID and influenza testing and pneumonia rule out.  He states that her symptoms of upper respiratory congestion, sore throat, cough have improved since they developed 9 days ago.  Home Medications Prior to Admission medications   Medication Sig Start Date End Date Taking? Authorizing Provider  amLODipine (NORVASC) 5 MG tablet Take 10 mg by mouth at bedtime.     [provider]  aspirin EC 81 MG tablet Take 81 mg by mouth at bedtime.     [provider]  B Complex-C-Zn-Folic Acid (DIALYVITE 756-EPPI 15) 0.8 MG TABS Take 1 tablet by mouth at bedtime.  05/05/19   [provider]  Besifloxacin HCl (BESIVANCE) 0.6 % SUSP INSTILL ONE DROP INTO RIGHT EYE 4 TIMES A DAY FOR 2 DAYS AFTER EACH MONTHLY EYE INJECTION 08/18/20   [provider]  carvedilol (COREG) 12.5 MG tablet Take 12.5 mg by mouth 2 (two) times daily with a meal. 1200 &2100    [provider]  cinacalcet (SENSIPAR) 30  MG tablet Take by mouth. 06/07/20   [provider]  DIPHENHYDRAMINE HCL PO Take by mouth. 07/25/20   [provider]  DULoxetine (CYMBALTA) 60 MG capsule Take 60 mg by mouth at bedtime.     [provider]  furosemide (LASIX) 40 MG tablet Take by mouth. 08/02/20   [provider]  ibuprofen (ADVIL) 200 MG tablet Take 400 mg by mouth every 6 (six) hours as needed for moderate pain.    [provider]  Lactulose 20 GM/30ML SOLN Take 15 mLs by mouth daily as needed (constipation.).     [provider]  losartan (COZAAR) 100 MG tablet Take 100 mg by mouth at bedtime.     [provider]  lovastatin (MEVACOR) 40 MG tablet Take 40 mg by mouth at bedtime.    [provider]  methocarbamol (ROBAXIN) 500 MG tablet Take 1 tablet (500 mg total) by mouth every 8 (eight) hours as needed for muscle spasms. 05/15/20   Loni Beckwith, PA-C  sevelamer carbonate (RENVELA) 800 MG tablet Take 1,600-2,400 mg by mouth See admin instructions. Take 3 tablets (2400 mg) by mouth with each meal & take 2 tablets (1600 mg) by mouth with each snack    [provider]  Vitamin D, Ergocalciferol, (DRISDOL) 1.25 MG (50000 UNIT) CAPS capsule Take 50,000 Units by mouth every Thursday. In the evening    [provider]      Allergies    Penicillins  Review of Systems   Review of Systems  All other systems reviewed and are negative.   Physical Exam Updated Vital Signs BP (!) 151/86 (BP Location: Right Arm)   Pulse 74   Temp 97.9 F (36.6 C) (Oral)   Resp 18   Ht 5' 1.5" (1.562 m)   Wt 74.9 kg   SpO2 100%   BMI 30.69 kg/m  Physical Exam Vitals and nursing note reviewed.  Constitutional:      General: She is not in acute distress.    Appearance: She is well-developed.  HENT:     Head: Normocephalic and atraumatic.  Eyes:     Conjunctiva/sclera: Conjunctivae normal.  Cardiovascular:     Rate and Rhythm: Normal rate and  regular rhythm.  Pulmonary:     Effort: Pulmonary effort is normal. No respiratory distress.     Breath sounds: Normal breath sounds.  Abdominal:     Palpations: Abdomen is soft.     Tenderness: There is no abdominal tenderness.  Musculoskeletal:        General: No swelling.     Cervical back: Neck supple.  Skin:    General: Skin is warm and dry.     Capillary Refill: Capillary refill takes less than 2 seconds.  Neurological:     Mental Status: She is alert.  Psychiatric:        Mood and Affect: Mood normal.     ED Results / Procedures / Treatments   Labs (all labs ordered are listed, but only abnormal results are displayed) Labs Reviewed  CBC WITH DIFFERENTIAL/PLATELET - Abnormal; Notable for the following components:      Result Value   WBC 3.2 (*)    Hemoglobin 11.8 (*)    Lymphs Abs 0.5 (*)    All other components within normal limits  BASIC METABOLIC PANEL - Abnormal; Notable for the following components:   Glucose, Bld 148 (*)    BUN 39 (*)    Creatinine, Ser 1.65 (*)    GFR, Estimated 37 (*)    All other components within normal limits  RESP PANEL BY RT-PCR (FLU A&B, COVID) ARPGX2  MAGNESIUM  PHOSPHORUS  D-DIMER, QUANTITATIVE    EKG EKG Interpretation  Date/Time:  Wednesday December 19 2021 07:46:45 EDT Ventricular Rate:  74 PR Interval:  156 QRS Duration: 87 QT Interval:  403 QTC Calculation: 448 R Axis:   21 Text Interpretation: Sinus rhythm Confirmed by Regan Lemming ([id number]) on 12/19/2021 7:55:12 AM  Radiology DG Chest Portable 1 View  Result Date: 12/19/2021 CLINICAL DATA:  History of cough and shortness of breath. EXAM: PORTABLE CHEST 1 VIEW COMPARISON:  Chest x-ray 08/03/2019. FINDINGS: Mediastinum and hilar structures normal. Heart size normal. Mild increased density noted over both lung bases again noted. This is most likely secondary to overlying soft tissue. No focal alveolar infiltrate. No pleural effusion or pneumothorax. Degenerative change  thoracic spine and both shoulders. Cystic changes in both humeral heads most likely degenerative. Surgical clips noted over the upper extremities. IMPRESSION: No acute cardiopulmonary disease identified. Electronically Signed   By: Marcello Moores  Register M.D.   On: 12/19/2021 08:02    Procedures Procedures    Medications Ordered in ED Medications - No data to display  ED Course/ Medical Decision Making/ A&P                           Medical Decision Making Amount and/or Complexity of Data Reviewed Labs: ordered.  Radiology: ordered.    51 year old female with medical history significant for HTN, HLD, gastroparesis, DM 2, ESRD status post renal transplant on immunosuppression who presents to the emergency department with multiple complaints.  The patient states that 9 days ago she developed a sore throat, cough, shortness of breath.  She developed increasing fatigue.  She is tolerating oral intake and denies any decreased urine output.  She states that 2 days ago she developed diarrhea which has since resolved.  The diarrhea was watery, nonbloody.  She denies any abdominal pain at this time.  She denies any dysuria or increased urinary frequency.  She denies any fevers or chills.  She called her renal transplant navigator and was advised to present to the emergency department for COVID and influenza testing and pneumonia rule out.  He states that her symptoms of upper respiratory congestion, sore throat, cough have improved since they developed 9 days ago.  On arrival, the patient was vitally stable, afebrile, not tachycardic or tachypneic, BP 151/86, saturating 100% on room air.  Sinus rhythm noted on cardiac telemetry.  Physical exam generally unremarkable as per above.  Lungs clear to auscultation bilaterally abdomen soft, nontender, nondistended, no rebound or guarding.  Differential diagnosis includes viral URI, COVID-19, influenza, developing bacterial pneumonia.  Patient symptoms have been  improving.  EKG was performed revealed sinus rhythm, ventricular rate 74, no acute ischemic changes.  A chest x-ray was performed which revealed no acute cardiac or pulmonary disease with no pneumothorax or pneumonia noted.  COVID-19 and influenza PCR testing was collected and resulted negative.  The patient's electrolytes were normal with normal magnesium and phosphorus, normal potassium.  Her renal function was significant for an elevated serum creatinine to 1.65 from a baseline of around 1.5-1.55.  The patient is tolerating oral intake and maintaining hydration.  She appears well-hydrated on exam.  She has no evidence of an acute pharyngitis and her symptoms have been resolving.  She certainly could have been positive for COVID-19 but it is negative today and is negative for influenza.  At this point, symptoms are consistent with likely viral URI which is since resolved or is mostly resolved.  Overall stable for routine outpatient follow-up at this time.   Final Clinical Impression(s) / ED Diagnoses Final diagnoses:  Viral URI  Chronic kidney disease, unspecified CKD stage  Renal transplant recipient    Rx / DC Orders ED Discharge Orders     None         Regan Lemming, MD 12/19/21 [phone]

## 2021-12-19 NOTE — ED Triage Notes (Signed)
Pt. Kaitlyn Le, cc poss. Covid r/o, states 9 days ago she began developing FLS that began with a cough and SOB, states 2 days ago she began developing diarrhea that has since gone away. States she is a kidney transplant recipient and was recommended by her primary to come in for a covid test and PNA r/o.

## 2021-12-19 NOTE — Discharge Instructions (Addendum)
Your symptoms are very consistent with a viral upper respiratory infection.  They have since been improving and have mostly resolved.  Your COVID-19 and influenza PCR testing was negative.  Your chest x-ray showed no evidence of a bacterial pneumonia  and you do not have an elevated white count.  You do not have symptoms concerning for a blood clot in your lungs.  Your laboratory testing was reassuring with normal magnesium, phosphorus, metabolic panel without significant electrolyte abnormality, kidney function was slightly elevated from baseline with a creatinine of 1.65 from a previous measurement of 1.55 at [facility].  Recommend routine outpatient follow-up as needed.

## 2021-12-20 DIAGNOSIS — D849 Immunodeficiency, unspecified: Secondary | ICD-10-CM | POA: Diagnosis not present

## 2021-12-20 DIAGNOSIS — Z940 Kidney transplant status: Secondary | ICD-10-CM | POA: Diagnosis not present

## 2021-12-20 DIAGNOSIS — E1121 Type 2 diabetes mellitus with diabetic nephropathy: Secondary | ICD-10-CM | POA: Diagnosis not present

## 2021-12-20 DIAGNOSIS — R82998 Other abnormal findings in urine: Secondary | ICD-10-CM | POA: Diagnosis not present

## 2021-12-20 DIAGNOSIS — I10 Essential (primary) hypertension: Secondary | ICD-10-CM | POA: Diagnosis not present

## 2021-12-25 ENCOUNTER — Telehealth: Payer: Self-pay

## 2021-12-25 NOTE — Telephone Encounter (Signed)
        Patient  visited [facility] on 12/19/2021  for Acute upper respiratory infection, unspecified.   Telephone encounter attempt :  1st  Unable to leave message.   [place] Resource Care Guide   ??[email or web address]  ?? [phone]   Website: [email or web address]  [email or web address]  "We don't say no, we SHOW how!"         [facility]

## 2021-12-26 ENCOUNTER — Telehealth: Payer: Self-pay

## 2021-12-26 NOTE — Telephone Encounter (Signed)
        Patient  visited [facility] on 12/19/2021  for URI.   Telephone encounter attempt :  2nd  A HIPAA compliant voice message was left requesting a return call.  Instructed patient to call back at [phone].   [place] Resource Care Guide   ??[email or web address]  ?? [phone]   Website: [email or web address]  [email or web address]  "We don't say no, we SHOW how!"         [facility]

## 2021-12-27 ENCOUNTER — Telehealth: Payer: Self-pay

## 2021-12-27 NOTE — Telephone Encounter (Signed)
        Patient  visited [facility] on 12/19/2021  for Acute upper respiratory infection, unspecified.   Telephone encounter attempt :  3rd  A HIPAA compliant voice message was left requesting a return call.  Instructed patient to call back at [phone].   [place] Resource Care Guide   [email or web address]  ?? [phone]   Website: [email or web address]  [email or web address]  "We don't say no, we SHOW how!"         [facility]

## 2022-02-14 DIAGNOSIS — Z940 Kidney transplant status: Secondary | ICD-10-CM | POA: Diagnosis not present

## 2022-02-14 DIAGNOSIS — E1122 Type 2 diabetes mellitus with diabetic chronic kidney disease: Secondary | ICD-10-CM | POA: Diagnosis not present

## 2022-02-14 DIAGNOSIS — N189 Chronic kidney disease, unspecified: Secondary | ICD-10-CM | POA: Diagnosis not present

## 2022-02-14 DIAGNOSIS — I129 Hypertensive chronic kidney disease with stage 1 through stage 4 chronic kidney disease, or unspecified chronic kidney disease: Secondary | ICD-10-CM | POA: Diagnosis not present

## 2022-02-14 DIAGNOSIS — N1832 Chronic kidney disease, stage 3b: Secondary | ICD-10-CM | POA: Diagnosis not present

## 2022-02-14 DIAGNOSIS — Z79899 Other long term (current) drug therapy: Secondary | ICD-10-CM | POA: Diagnosis not present

## 2022-03-05 IMAGING — MG DIGITAL SCREENING BILAT W/ TOMO W/ CAD
6 of 10 series · 6 of 30 positions shown · non-contrast
Comparison: None.

CLINICAL DATA: Screening.

EXAM:
DIGITAL SCREENING BILATERAL MAMMOGRAM WITH TOMO AND CAD

[L CC synth-2D]
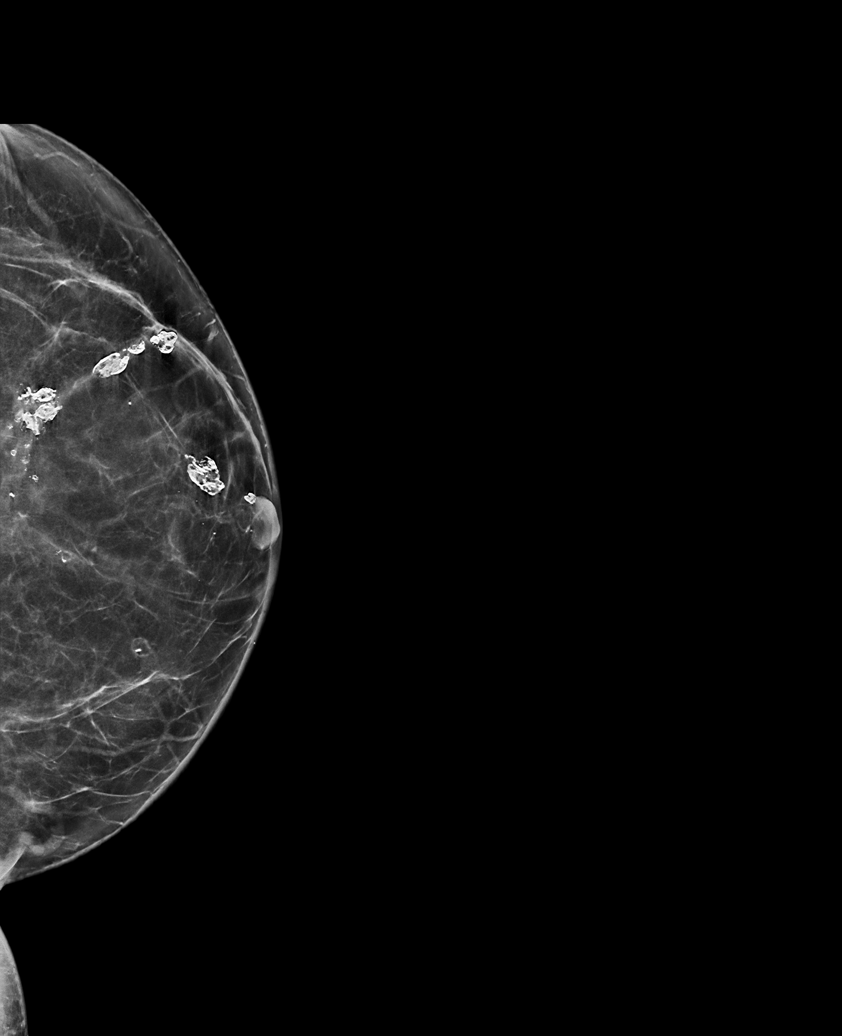

[R MLO synth-2D]
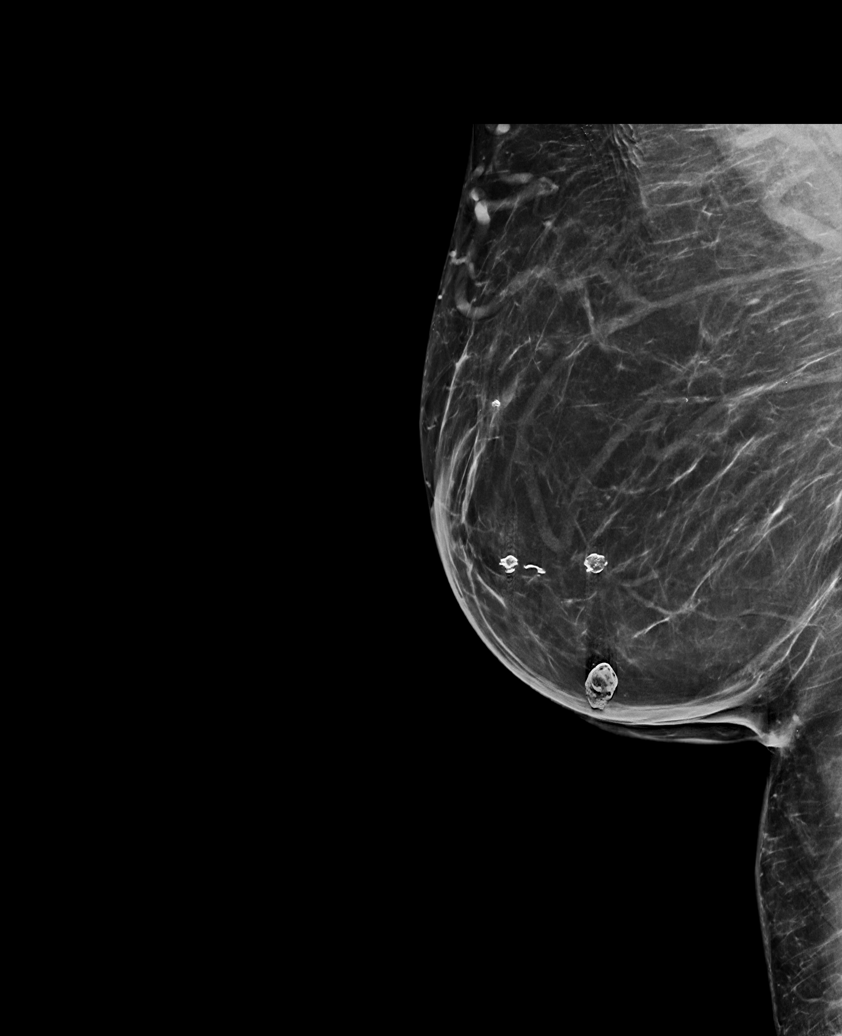

[L MLO synth-2D (1 of 2)]
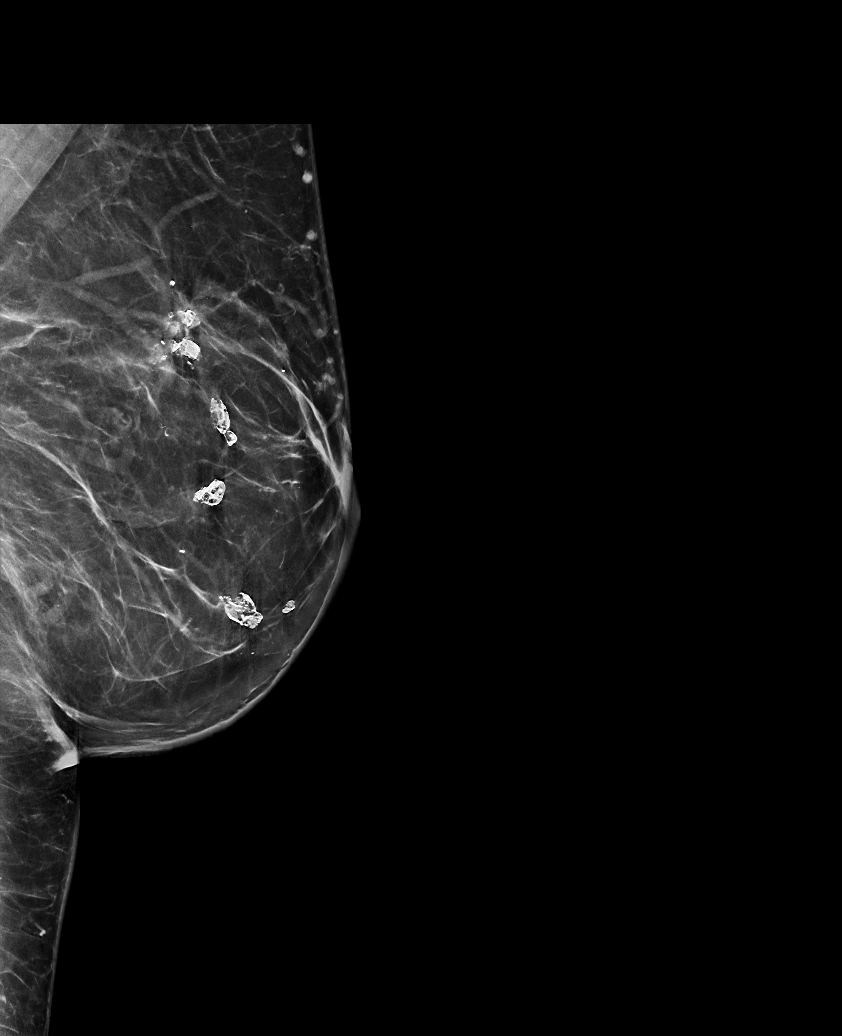

[R CC synth-2D]
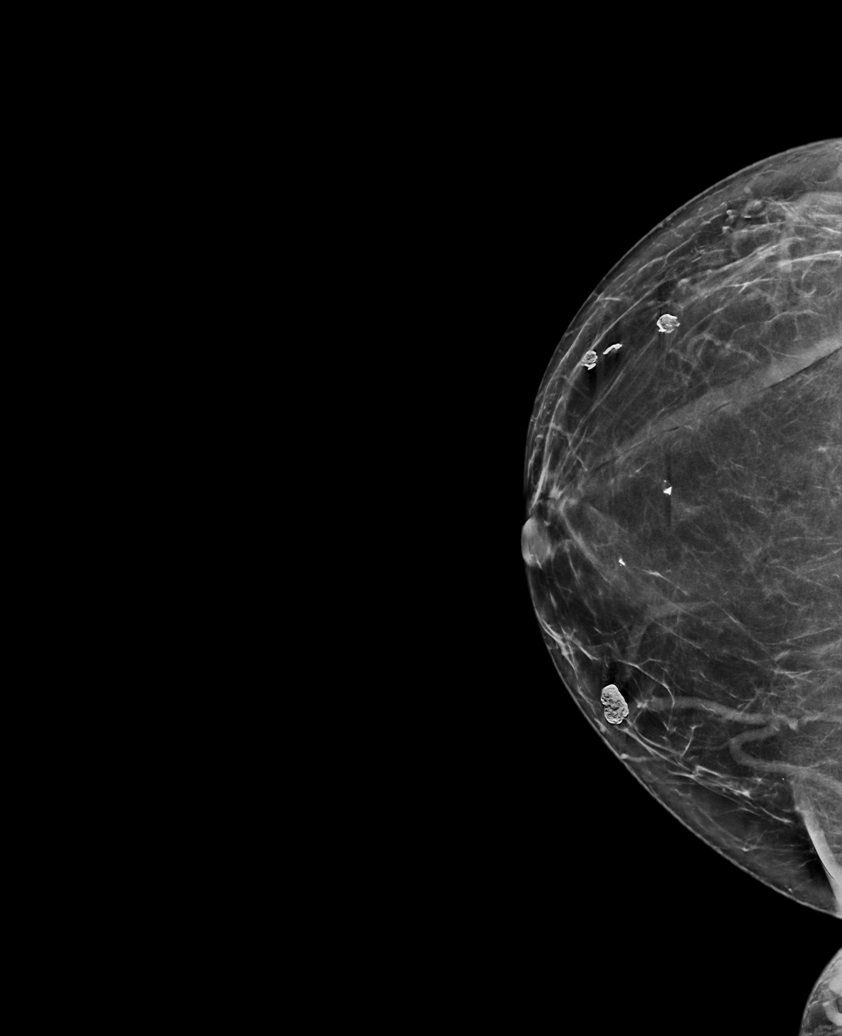

[L MLO synth-2D (2 of 2)]
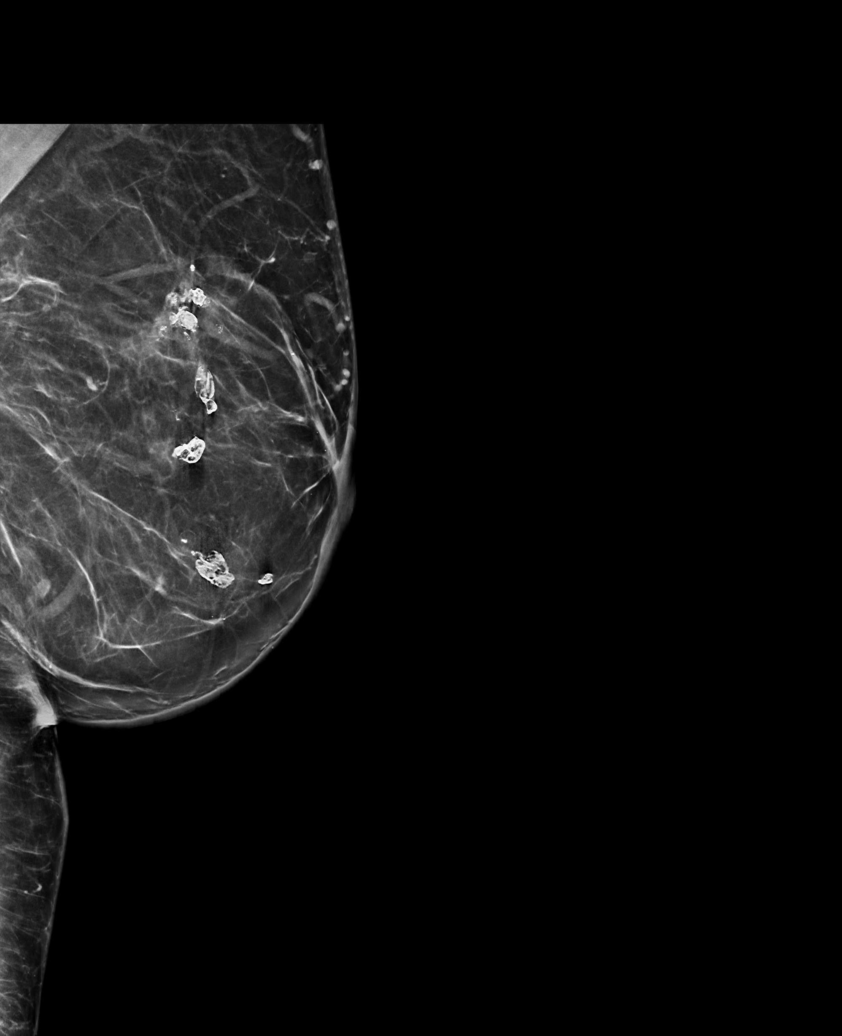

[R CC tomo · tomo slice 37/72.0]
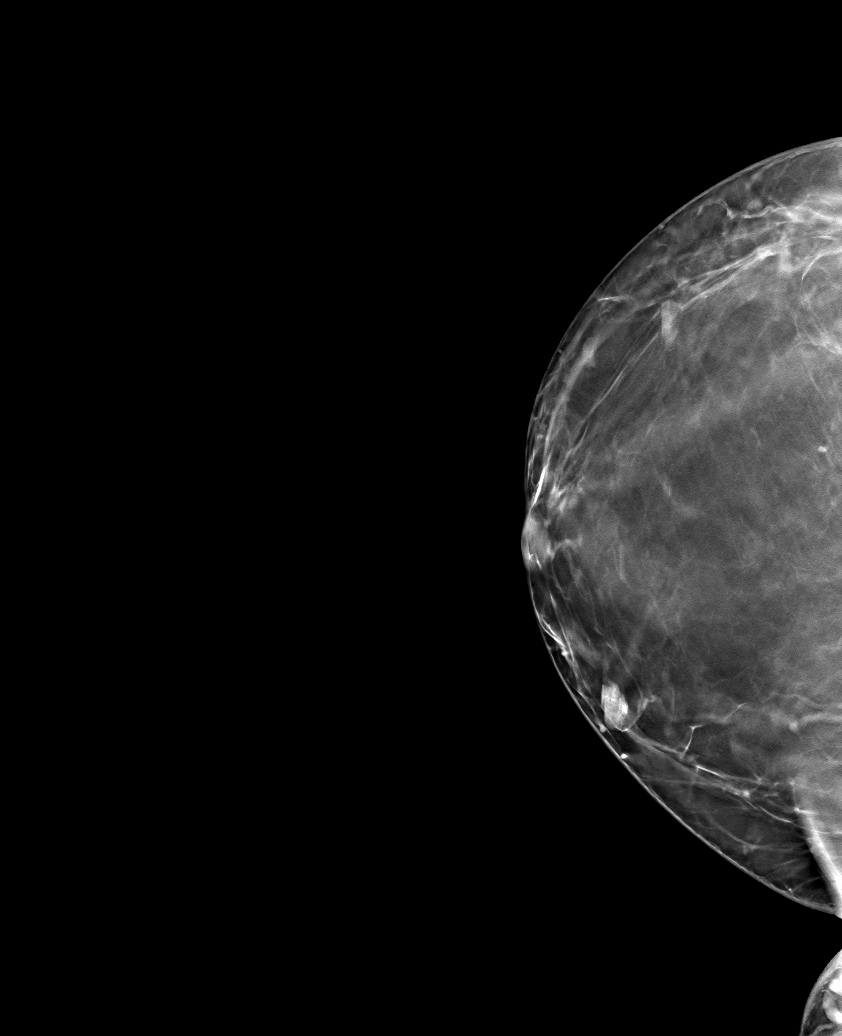

[6 of 30 positions shown; findings below may reference images not displayed]

ACR Breast Density Category b: There are scattered areas of
fibroglandular density.
FINDINGS: There are no findings suspicious for malignancy. Images were
processed with CAD.
IMPRESSION: No mammographic evidence of malignancy. A result letter of this
screening mammogram will be mailed directly to the patient.

RECOMMENDATION:
Screening mammogram in one year. (Code:Y5-G-EJ6)

BI-RADS CATEGORY  1: Negative.

## 2022-03-12 DIAGNOSIS — Z940 Kidney transplant status: Secondary | ICD-10-CM | POA: Diagnosis not present

## 2022-06-12 DIAGNOSIS — Z940 Kidney transplant status: Secondary | ICD-10-CM | POA: Diagnosis not present

## 2022-06-18 DIAGNOSIS — N1832 Chronic kidney disease, stage 3b: Secondary | ICD-10-CM | POA: Diagnosis not present

## 2022-06-18 DIAGNOSIS — E1122 Type 2 diabetes mellitus with diabetic chronic kidney disease: Secondary | ICD-10-CM | POA: Diagnosis not present

## 2022-06-18 DIAGNOSIS — I129 Hypertensive chronic kidney disease with stage 1 through stage 4 chronic kidney disease, or unspecified chronic kidney disease: Secondary | ICD-10-CM | POA: Diagnosis not present

## 2022-06-18 DIAGNOSIS — Z940 Kidney transplant status: Secondary | ICD-10-CM | POA: Diagnosis not present

## 2022-10-28 DIAGNOSIS — N1832 Chronic kidney disease, stage 3b: Secondary | ICD-10-CM | POA: Diagnosis not present

## 2022-10-28 DIAGNOSIS — Z940 Kidney transplant status: Secondary | ICD-10-CM | POA: Diagnosis not present

## 2022-10-30 DIAGNOSIS — E1122 Type 2 diabetes mellitus with diabetic chronic kidney disease: Secondary | ICD-10-CM | POA: Diagnosis not present

## 2022-10-30 DIAGNOSIS — N1832 Chronic kidney disease, stage 3b: Secondary | ICD-10-CM | POA: Diagnosis not present

## 2022-10-30 DIAGNOSIS — Z940 Kidney transplant status: Secondary | ICD-10-CM | POA: Diagnosis not present

## 2022-10-30 DIAGNOSIS — I129 Hypertensive chronic kidney disease with stage 1 through stage 4 chronic kidney disease, or unspecified chronic kidney disease: Secondary | ICD-10-CM | POA: Diagnosis not present

## 2022-12-23 DIAGNOSIS — Z794 Long term (current) use of insulin: Secondary | ICD-10-CM | POA: Diagnosis not present

## 2022-12-23 DIAGNOSIS — E1165 Type 2 diabetes mellitus with hyperglycemia: Secondary | ICD-10-CM | POA: Diagnosis not present

## 2022-12-23 DIAGNOSIS — Z940 Kidney transplant status: Secondary | ICD-10-CM | POA: Diagnosis not present

## 2023-01-03 DIAGNOSIS — Z0000 Encounter for general adult medical examination without abnormal findings: Secondary | ICD-10-CM | POA: Diagnosis not present

## 2023-01-03 DIAGNOSIS — E0800 Diabetes mellitus due to underlying condition with hyperosmolarity without nonketotic hyperglycemic-hyperosmolar coma (NKHHC): Secondary | ICD-10-CM | POA: Diagnosis not present

## 2023-01-03 DIAGNOSIS — F341 Dysthymic disorder: Secondary | ICD-10-CM | POA: Diagnosis not present

## 2023-01-20 DIAGNOSIS — Z0000 Encounter for general adult medical examination without abnormal findings: Secondary | ICD-10-CM | POA: Diagnosis not present

## 2023-01-20 DIAGNOSIS — Z23 Encounter for immunization: Secondary | ICD-10-CM | POA: Diagnosis not present

## 2023-01-20 DIAGNOSIS — M62838 Other muscle spasm: Secondary | ICD-10-CM | POA: Diagnosis not present
